# Patient Record
Sex: Female | Born: 1955 | Race: Black or African American | Hispanic: No | Smoking: Light tobacco smoker
Health system: Southern US, Community
[De-identification: ages and names within clinical notes are randomized; demographics above are authoritative.]

## PROBLEM LIST (undated history)

## (undated) DIAGNOSIS — E785 Hyperlipidemia, unspecified: Secondary | ICD-10-CM

## (undated) DIAGNOSIS — F329 Major depressive disorder, single episode, unspecified: Secondary | ICD-10-CM

## (undated) DIAGNOSIS — Z21 Asymptomatic human immunodeficiency virus [HIV] infection status: Secondary | ICD-10-CM

## (undated) DIAGNOSIS — B20 Human immunodeficiency virus [HIV] disease: Secondary | ICD-10-CM

## (undated) DIAGNOSIS — F32A Depression, unspecified: Secondary | ICD-10-CM

## (undated) HISTORY — DX: Human immunodeficiency virus (HIV) disease: B20

## (undated) HISTORY — DX: Asymptomatic human immunodeficiency virus (hiv) infection status: Z21

## (undated) HISTORY — PX: ABDOMINAL HYSTERECTOMY: SHX81

## (undated) HISTORY — DX: Hyperlipidemia, unspecified: E78.5

## (undated) HISTORY — DX: Major depressive disorder, single episode, unspecified: F32.9

## (undated) HISTORY — DX: Depression, unspecified: F32.A

---

## 1990-08-13 ENCOUNTER — Encounter (INDEPENDENT_AMBULATORY_CARE_PROVIDER_SITE_OTHER): Payer: Self-pay | Admitting: *Deleted

## 1990-08-13 LAB — CONVERTED CEMR LAB
CD4 Count: 257 microliters
CD4 T Cell Abs: 257

## 1998-02-26 ENCOUNTER — Encounter: Admission: RE | Admit: 1998-02-26 | Discharge: 1998-02-26 | Payer: Self-pay | Admitting: Internal Medicine

## 1998-03-21 ENCOUNTER — Encounter: Admission: RE | Admit: 1998-03-21 | Discharge: 1998-03-21 | Payer: Self-pay | Admitting: Internal Medicine

## 1998-06-05 ENCOUNTER — Encounter: Admission: RE | Admit: 1998-06-05 | Discharge: 1998-06-05 | Payer: Self-pay | Admitting: Internal Medicine

## 1998-07-09 ENCOUNTER — Encounter: Admission: RE | Admit: 1998-07-09 | Discharge: 1998-07-09 | Payer: Self-pay | Admitting: Internal Medicine

## 1998-10-16 ENCOUNTER — Encounter: Admission: RE | Admit: 1998-10-16 | Discharge: 1998-10-16 | Payer: Self-pay | Admitting: Internal Medicine

## 1998-12-18 ENCOUNTER — Encounter: Admission: RE | Admit: 1998-12-18 | Discharge: 1998-12-18 | Payer: Self-pay | Admitting: Internal Medicine

## 1999-01-01 ENCOUNTER — Ambulatory Visit (HOSPITAL_COMMUNITY): Admission: RE | Admit: 1999-01-01 | Discharge: 1999-01-01 | Payer: Self-pay

## 1999-02-04 ENCOUNTER — Ambulatory Visit (HOSPITAL_COMMUNITY): Admission: RE | Admit: 1999-02-04 | Discharge: 1999-02-04 | Payer: Self-pay | Admitting: Internal Medicine

## 1999-02-26 ENCOUNTER — Encounter: Admission: RE | Admit: 1999-02-26 | Discharge: 1999-02-26 | Payer: Self-pay | Admitting: Internal Medicine

## 1999-04-09 ENCOUNTER — Encounter: Admission: RE | Admit: 1999-04-09 | Discharge: 1999-04-09 | Payer: Self-pay | Admitting: Internal Medicine

## 1999-05-10 ENCOUNTER — Inpatient Hospital Stay (HOSPITAL_COMMUNITY): Admission: EM | Admit: 1999-05-10 | Discharge: 1999-05-13 | Payer: Self-pay | Admitting: Emergency Medicine

## 1999-05-10 ENCOUNTER — Encounter: Payer: Self-pay | Admitting: Emergency Medicine

## 1999-05-11 ENCOUNTER — Encounter: Payer: Self-pay | Admitting: Internal Medicine

## 1999-07-08 ENCOUNTER — Encounter: Admission: RE | Admit: 1999-07-08 | Discharge: 1999-07-08 | Payer: Self-pay | Admitting: Internal Medicine

## 1999-08-13 ENCOUNTER — Encounter: Admission: RE | Admit: 1999-08-13 | Discharge: 1999-08-13 | Payer: Self-pay | Admitting: Internal Medicine

## 1999-12-03 ENCOUNTER — Ambulatory Visit (HOSPITAL_COMMUNITY): Admission: RE | Admit: 1999-12-03 | Discharge: 1999-12-03 | Payer: Self-pay | Admitting: Internal Medicine

## 1999-12-03 ENCOUNTER — Encounter: Admission: RE | Admit: 1999-12-03 | Discharge: 1999-12-03 | Payer: Self-pay | Admitting: Internal Medicine

## 1999-12-24 ENCOUNTER — Encounter: Admission: RE | Admit: 1999-12-24 | Discharge: 1999-12-24 | Payer: Self-pay | Admitting: Internal Medicine

## 2000-03-17 ENCOUNTER — Ambulatory Visit (HOSPITAL_COMMUNITY): Admission: RE | Admit: 2000-03-17 | Discharge: 2000-03-17 | Payer: Self-pay | Admitting: Internal Medicine

## 2000-03-17 ENCOUNTER — Encounter: Admission: RE | Admit: 2000-03-17 | Discharge: 2000-03-17 | Payer: Self-pay | Admitting: Internal Medicine

## 2000-05-19 ENCOUNTER — Encounter: Admission: RE | Admit: 2000-05-19 | Discharge: 2000-05-19 | Payer: Self-pay | Admitting: Internal Medicine

## 2000-06-22 ENCOUNTER — Encounter: Admission: RE | Admit: 2000-06-22 | Discharge: 2000-06-22 | Payer: Self-pay | Admitting: Internal Medicine

## 2000-11-03 ENCOUNTER — Encounter: Admission: RE | Admit: 2000-11-03 | Discharge: 2000-11-03 | Payer: Self-pay | Admitting: Internal Medicine

## 2000-11-03 ENCOUNTER — Ambulatory Visit (HOSPITAL_COMMUNITY): Admission: RE | Admit: 2000-11-03 | Discharge: 2000-11-03 | Payer: Self-pay | Admitting: Internal Medicine

## 2001-01-12 ENCOUNTER — Ambulatory Visit (HOSPITAL_COMMUNITY): Admission: RE | Admit: 2001-01-12 | Discharge: 2001-01-12 | Payer: Self-pay | Admitting: Internal Medicine

## 2001-01-12 ENCOUNTER — Encounter: Admission: RE | Admit: 2001-01-12 | Discharge: 2001-01-12 | Payer: Self-pay | Admitting: Internal Medicine

## 2001-05-18 ENCOUNTER — Encounter: Admission: RE | Admit: 2001-05-18 | Discharge: 2001-05-18 | Payer: Self-pay | Admitting: Internal Medicine

## 2001-07-06 ENCOUNTER — Encounter: Admission: RE | Admit: 2001-07-06 | Discharge: 2001-07-06 | Payer: Self-pay | Admitting: Internal Medicine

## 2001-08-17 ENCOUNTER — Ambulatory Visit (HOSPITAL_COMMUNITY): Admission: RE | Admit: 2001-08-17 | Discharge: 2001-08-17 | Payer: Self-pay | Admitting: Internal Medicine

## 2001-08-17 ENCOUNTER — Encounter: Admission: RE | Admit: 2001-08-17 | Discharge: 2001-08-17 | Payer: Self-pay | Admitting: Internal Medicine

## 2001-12-14 ENCOUNTER — Encounter: Admission: RE | Admit: 2001-12-14 | Discharge: 2001-12-14 | Payer: Self-pay | Admitting: Internal Medicine

## 2001-12-14 ENCOUNTER — Ambulatory Visit (HOSPITAL_COMMUNITY): Admission: RE | Admit: 2001-12-14 | Discharge: 2001-12-14 | Payer: Self-pay | Admitting: Internal Medicine

## 2002-04-19 ENCOUNTER — Encounter: Admission: RE | Admit: 2002-04-19 | Discharge: 2002-04-19 | Payer: Self-pay | Admitting: Internal Medicine

## 2002-04-26 ENCOUNTER — Encounter: Admission: RE | Admit: 2002-04-26 | Discharge: 2002-04-26 | Payer: Self-pay | Admitting: Internal Medicine

## 2002-04-26 ENCOUNTER — Ambulatory Visit (HOSPITAL_COMMUNITY): Admission: RE | Admit: 2002-04-26 | Discharge: 2002-04-26 | Payer: Self-pay | Admitting: Internal Medicine

## 2002-06-21 ENCOUNTER — Encounter: Admission: RE | Admit: 2002-06-21 | Discharge: 2002-06-21 | Payer: Self-pay | Admitting: Internal Medicine

## 2003-03-14 ENCOUNTER — Encounter: Payer: Self-pay | Admitting: Internal Medicine

## 2003-03-14 ENCOUNTER — Encounter: Admission: RE | Admit: 2003-03-14 | Discharge: 2003-03-14 | Payer: Self-pay | Admitting: Internal Medicine

## 2003-05-02 ENCOUNTER — Encounter: Admission: RE | Admit: 2003-05-02 | Discharge: 2003-05-02 | Payer: Self-pay | Admitting: Internal Medicine

## 2003-08-01 ENCOUNTER — Ambulatory Visit (HOSPITAL_COMMUNITY): Admission: RE | Admit: 2003-08-01 | Discharge: 2003-08-01 | Payer: Self-pay | Admitting: Internal Medicine

## 2003-08-01 ENCOUNTER — Encounter: Admission: RE | Admit: 2003-08-01 | Discharge: 2003-08-01 | Payer: Self-pay | Admitting: Internal Medicine

## 2003-08-01 ENCOUNTER — Encounter: Payer: Self-pay | Admitting: Internal Medicine

## 2003-11-28 ENCOUNTER — Encounter: Admission: RE | Admit: 2003-11-28 | Discharge: 2003-11-28 | Payer: Self-pay | Admitting: Internal Medicine

## 2004-02-27 ENCOUNTER — Encounter: Admission: RE | Admit: 2004-02-27 | Discharge: 2004-02-27 | Payer: Self-pay | Admitting: Internal Medicine

## 2004-05-28 ENCOUNTER — Encounter: Admission: RE | Admit: 2004-05-28 | Discharge: 2004-05-28 | Payer: Self-pay | Admitting: Internal Medicine

## 2004-05-28 ENCOUNTER — Ambulatory Visit (HOSPITAL_COMMUNITY): Admission: RE | Admit: 2004-05-28 | Discharge: 2004-05-28 | Payer: Self-pay | Admitting: Internal Medicine

## 2004-08-20 ENCOUNTER — Ambulatory Visit: Payer: Self-pay | Admitting: Internal Medicine

## 2004-08-20 ENCOUNTER — Ambulatory Visit (HOSPITAL_COMMUNITY): Admission: RE | Admit: 2004-08-20 | Discharge: 2004-08-20 | Payer: Self-pay | Admitting: Internal Medicine

## 2005-03-18 ENCOUNTER — Ambulatory Visit (HOSPITAL_COMMUNITY): Admission: RE | Admit: 2005-03-18 | Discharge: 2005-03-18 | Payer: Self-pay | Admitting: Internal Medicine

## 2005-03-18 ENCOUNTER — Ambulatory Visit: Payer: Self-pay | Admitting: Internal Medicine

## 2005-04-10 ENCOUNTER — Ambulatory Visit: Payer: Self-pay | Admitting: Internal Medicine

## 2005-06-10 ENCOUNTER — Ambulatory Visit: Payer: Self-pay | Admitting: Internal Medicine

## 2005-08-12 ENCOUNTER — Ambulatory Visit (HOSPITAL_COMMUNITY): Admission: RE | Admit: 2005-08-12 | Discharge: 2005-08-12 | Payer: Self-pay | Admitting: Internal Medicine

## 2005-08-12 ENCOUNTER — Ambulatory Visit: Payer: Self-pay | Admitting: Internal Medicine

## 2005-11-04 ENCOUNTER — Ambulatory Visit: Payer: Self-pay | Admitting: Internal Medicine

## 2005-12-18 ENCOUNTER — Ambulatory Visit: Payer: Self-pay | Admitting: Internal Medicine

## 2005-12-18 ENCOUNTER — Encounter: Admission: RE | Admit: 2005-12-18 | Discharge: 2005-12-18 | Payer: Self-pay | Admitting: Internal Medicine

## 2005-12-18 ENCOUNTER — Encounter (INDEPENDENT_AMBULATORY_CARE_PROVIDER_SITE_OTHER): Payer: Self-pay | Admitting: *Deleted

## 2005-12-18 LAB — CONVERTED CEMR LAB
CD4 Count: 360 microliters
HIV 1 RNA Quant: 1000001 copies/mL

## 2006-03-26 ENCOUNTER — Encounter (INDEPENDENT_AMBULATORY_CARE_PROVIDER_SITE_OTHER): Payer: Self-pay | Admitting: *Deleted

## 2006-03-26 ENCOUNTER — Ambulatory Visit: Payer: Self-pay | Admitting: Internal Medicine

## 2006-03-26 ENCOUNTER — Encounter: Admission: RE | Admit: 2006-03-26 | Discharge: 2006-03-26 | Payer: Self-pay | Admitting: Internal Medicine

## 2006-03-26 LAB — CONVERTED CEMR LAB
CD4 Count: 310 microliters
HIV 1 RNA Quant: 399 copies/mL

## 2006-05-19 ENCOUNTER — Ambulatory Visit: Payer: Self-pay | Admitting: Internal Medicine

## 2006-07-14 ENCOUNTER — Encounter: Admission: RE | Admit: 2006-07-14 | Discharge: 2006-07-14 | Payer: Self-pay | Admitting: Internal Medicine

## 2006-07-14 ENCOUNTER — Encounter (INDEPENDENT_AMBULATORY_CARE_PROVIDER_SITE_OTHER): Payer: Self-pay | Admitting: *Deleted

## 2006-07-14 ENCOUNTER — Ambulatory Visit: Payer: Self-pay | Admitting: Internal Medicine

## 2006-07-14 LAB — CONVERTED CEMR LAB
CD4 Count: 290 microliters
HIV 1 RNA Quant: 49 copies/mL

## 2006-07-23 DIAGNOSIS — N879 Dysplasia of cervix uteri, unspecified: Secondary | ICD-10-CM | POA: Insufficient documentation

## 2006-07-23 DIAGNOSIS — B360 Pityriasis versicolor: Secondary | ICD-10-CM | POA: Insufficient documentation

## 2006-07-23 DIAGNOSIS — F329 Major depressive disorder, single episode, unspecified: Secondary | ICD-10-CM

## 2006-07-23 DIAGNOSIS — R599 Enlarged lymph nodes, unspecified: Secondary | ICD-10-CM | POA: Insufficient documentation

## 2006-07-23 DIAGNOSIS — B0052 Herpesviral keratitis: Secondary | ICD-10-CM

## 2006-07-23 DIAGNOSIS — K029 Dental caries, unspecified: Secondary | ICD-10-CM | POA: Insufficient documentation

## 2006-07-23 DIAGNOSIS — A6000 Herpesviral infection of urogenital system, unspecified: Secondary | ICD-10-CM | POA: Insufficient documentation

## 2006-07-23 DIAGNOSIS — B3781 Candidal esophagitis: Secondary | ICD-10-CM | POA: Insufficient documentation

## 2006-07-23 DIAGNOSIS — B20 Human immunodeficiency virus [HIV] disease: Secondary | ICD-10-CM | POA: Insufficient documentation

## 2006-07-23 DIAGNOSIS — B078 Other viral warts: Secondary | ICD-10-CM | POA: Insufficient documentation

## 2006-07-23 DIAGNOSIS — L0293 Carbuncle, unspecified: Secondary | ICD-10-CM

## 2006-07-23 DIAGNOSIS — H1045 Other chronic allergic conjunctivitis: Secondary | ICD-10-CM

## 2006-07-23 DIAGNOSIS — N739 Female pelvic inflammatory disease, unspecified: Secondary | ICD-10-CM | POA: Insufficient documentation

## 2006-07-23 DIAGNOSIS — D649 Anemia, unspecified: Secondary | ICD-10-CM

## 2006-07-23 DIAGNOSIS — F411 Generalized anxiety disorder: Secondary | ICD-10-CM | POA: Insufficient documentation

## 2006-07-23 DIAGNOSIS — F1910 Other psychoactive substance abuse, uncomplicated: Secondary | ICD-10-CM

## 2006-07-23 DIAGNOSIS — L0292 Furuncle, unspecified: Secondary | ICD-10-CM | POA: Insufficient documentation

## 2006-07-23 DIAGNOSIS — A539 Syphilis, unspecified: Secondary | ICD-10-CM

## 2006-07-23 DIAGNOSIS — J189 Pneumonia, unspecified organism: Secondary | ICD-10-CM | POA: Insufficient documentation

## 2006-11-04 ENCOUNTER — Encounter: Payer: Self-pay | Admitting: Internal Medicine

## 2006-12-02 DIAGNOSIS — Z9089 Acquired absence of other organs: Secondary | ICD-10-CM

## 2006-12-07 ENCOUNTER — Encounter (INDEPENDENT_AMBULATORY_CARE_PROVIDER_SITE_OTHER): Payer: Self-pay | Admitting: *Deleted

## 2006-12-07 LAB — CONVERTED CEMR LAB: Pap Smear: ABNORMAL

## 2006-12-08 ENCOUNTER — Telehealth (INDEPENDENT_AMBULATORY_CARE_PROVIDER_SITE_OTHER): Payer: Self-pay | Admitting: *Deleted

## 2006-12-08 ENCOUNTER — Ambulatory Visit: Payer: Self-pay | Admitting: Internal Medicine

## 2006-12-20 ENCOUNTER — Encounter (INDEPENDENT_AMBULATORY_CARE_PROVIDER_SITE_OTHER): Payer: Self-pay | Admitting: *Deleted

## 2007-02-17 ENCOUNTER — Telehealth: Payer: Self-pay

## 2007-03-30 ENCOUNTER — Ambulatory Visit: Payer: Self-pay | Admitting: Internal Medicine

## 2007-03-30 ENCOUNTER — Encounter: Admission: RE | Admit: 2007-03-30 | Discharge: 2007-03-30 | Payer: Self-pay | Admitting: Internal Medicine

## 2007-04-20 ENCOUNTER — Encounter: Payer: Self-pay | Admitting: Internal Medicine

## 2007-04-27 ENCOUNTER — Encounter: Payer: Self-pay | Admitting: Internal Medicine

## 2007-04-27 ENCOUNTER — Other Ambulatory Visit: Admission: RE | Admit: 2007-04-27 | Discharge: 2007-04-27 | Payer: Self-pay | Admitting: Otolaryngology

## 2007-05-06 ENCOUNTER — Encounter: Payer: Self-pay | Admitting: Internal Medicine

## 2007-05-14 ENCOUNTER — Encounter: Payer: Self-pay | Admitting: Internal Medicine

## 2007-05-17 ENCOUNTER — Encounter (INDEPENDENT_AMBULATORY_CARE_PROVIDER_SITE_OTHER): Payer: Self-pay | Admitting: *Deleted

## 2007-05-18 ENCOUNTER — Ambulatory Visit: Payer: Self-pay | Admitting: Internal Medicine

## 2007-05-18 DIAGNOSIS — B351 Tinea unguium: Secondary | ICD-10-CM

## 2007-07-16 ENCOUNTER — Encounter: Payer: Self-pay | Admitting: Internal Medicine

## 2007-11-04 ENCOUNTER — Telehealth: Payer: Self-pay | Admitting: Internal Medicine

## 2007-11-11 ENCOUNTER — Encounter: Payer: Self-pay | Admitting: Internal Medicine

## 2007-11-16 ENCOUNTER — Encounter: Admission: RE | Admit: 2007-11-16 | Discharge: 2007-11-16 | Payer: Self-pay | Admitting: Internal Medicine

## 2007-11-16 ENCOUNTER — Ambulatory Visit: Payer: Self-pay | Admitting: Internal Medicine

## 2007-11-16 DIAGNOSIS — H60399 Other infective otitis externa, unspecified ear: Secondary | ICD-10-CM | POA: Insufficient documentation

## 2007-11-16 LAB — CONVERTED CEMR LAB
Alkaline Phosphatase: 99 units/L (ref 39–117)
BUN: 14 mg/dL (ref 6–23)
Glucose, Bld: 83 mg/dL (ref 70–99)
HDL: 41 mg/dL (ref >39)
LDL Cholesterol: 107 mg/dL — ABNORMAL HIGH (ref 0–99)
MCHC: 32.1 g/dL (ref 30.0–36.0)
MCV: 86.7 fL (ref 78.0–100.0)
RBC: 4.67 M/uL (ref 3.87–5.11)
Sodium: 143 meq/L (ref 135–145)
Total Bilirubin: 0.3 mg/dL (ref 0.3–1.2)
Total CHOL/HDL Ratio: 5.1
Total Protein: 7.2 g/dL (ref 6.0–8.3)
Triglycerides: 308 mg/dL — ABNORMAL HIGH (ref <150)
VLDL: 62 mg/dL — ABNORMAL HIGH (ref 0–40)

## 2008-04-18 ENCOUNTER — Encounter: Admission: RE | Admit: 2008-04-18 | Discharge: 2008-04-18 | Payer: Self-pay | Admitting: Internal Medicine

## 2008-04-18 ENCOUNTER — Ambulatory Visit: Payer: Self-pay | Admitting: Internal Medicine

## 2008-04-18 LAB — CONVERTED CEMR LAB
ALT: 31 units/L (ref 0–35)
Alkaline Phosphatase: 101 units/L (ref 39–117)
Basophils Absolute: 0 10*3/uL (ref 0.0–0.1)
Creatinine, Ser: 1.15 mg/dL (ref 0.40–1.20)
Eosinophils Absolute: 0.1 10*3/uL (ref 0.0–0.7)
Eosinophils Relative: 3 % (ref 0–5)
HCT: 39 % (ref 36.0–46.0)
HIV-1 RNA Quant, Log: <1.7 (ref <1.70)
MCHC: 31 g/dL (ref 30.0–36.0)
MCV: 87.6 fL (ref 78.0–100.0)
Platelets: 228 10*3/uL (ref 150–400)
RDW: 14.4 % (ref 11.5–15.5)
Sodium: 145 meq/L (ref 135–145)
Total Bilirubin: 0.4 mg/dL (ref 0.3–1.2)
Total Protein: 6.8 g/dL (ref 6.0–8.3)

## 2008-05-09 ENCOUNTER — Ambulatory Visit: Payer: Self-pay | Admitting: Internal Medicine

## 2008-05-09 DIAGNOSIS — F17200 Nicotine dependence, unspecified, uncomplicated: Secondary | ICD-10-CM

## 2008-05-09 DIAGNOSIS — H409 Unspecified glaucoma: Secondary | ICD-10-CM | POA: Insufficient documentation

## 2008-06-07 ENCOUNTER — Encounter: Payer: Self-pay | Admitting: Internal Medicine

## 2008-06-16 ENCOUNTER — Telehealth: Payer: Self-pay | Admitting: Internal Medicine

## 2008-07-06 ENCOUNTER — Encounter: Payer: Self-pay | Admitting: Internal Medicine

## 2008-07-09 ENCOUNTER — Encounter: Payer: Self-pay | Admitting: Internal Medicine

## 2008-07-25 ENCOUNTER — Encounter: Payer: Self-pay | Admitting: Internal Medicine

## 2008-10-23 ENCOUNTER — Encounter: Payer: Self-pay | Admitting: Internal Medicine

## 2008-11-02 ENCOUNTER — Ambulatory Visit: Payer: Self-pay | Admitting: Internal Medicine

## 2008-11-02 LAB — CONVERTED CEMR LAB
ALT: 18 units/L (ref 0–35)
Alkaline Phosphatase: 91 units/L (ref 39–117)
BUN: 16 mg/dL (ref 6–23)
Basophils Relative: 1 % (ref 0–1)
Chloride: 108 meq/L (ref 96–112)
Glucose, Bld: 68 mg/dL — ABNORMAL LOW (ref 70–99)
HIV 1 RNA Quant: <48 copies/mL (ref <48)
HIV-1 RNA Quant, Log: <1.68 (ref <1.68)
Hemoglobin: 12.7 g/dL (ref 12.0–15.0)
LDL Cholesterol: 152 mg/dL — ABNORMAL HIGH (ref 0–99)
Lymphs Abs: 1.9 10*3/uL (ref 0.7–4.0)
MCHC: 32.8 g/dL (ref 30.0–36.0)
MCV: 86 fL (ref 78.0–100.0)
Monocytes Absolute: 0.5 10*3/uL (ref 0.1–1.0)
Monocytes Relative: 14 % — ABNORMAL HIGH (ref 3–12)
Neutro Abs: 1.4 10*3/uL — ABNORMAL LOW (ref 1.7–7.7)
Potassium: 4.1 meq/L (ref 3.5–5.3)
RBC: 4.5 M/uL (ref 3.87–5.11)
Total Bilirubin: 0.6 mg/dL (ref 0.3–1.2)
Total Protein: 7 g/dL (ref 6.0–8.3)
VLDL: 46 mg/dL — ABNORMAL HIGH (ref 0–40)
WBC: 3.9 10*3/uL — ABNORMAL LOW (ref 4.0–10.5)

## 2008-11-15 ENCOUNTER — Encounter: Payer: Self-pay | Admitting: Internal Medicine

## 2008-11-16 ENCOUNTER — Ambulatory Visit: Payer: Self-pay | Admitting: Internal Medicine

## 2008-11-16 DIAGNOSIS — R61 Generalized hyperhidrosis: Secondary | ICD-10-CM | POA: Insufficient documentation

## 2008-12-05 ENCOUNTER — Encounter (INDEPENDENT_AMBULATORY_CARE_PROVIDER_SITE_OTHER): Payer: Self-pay | Admitting: *Deleted

## 2008-12-05 LAB — CONVERTED CEMR LAB: Pap Smear: ABNORMAL

## 2009-02-13 ENCOUNTER — Ambulatory Visit: Payer: Self-pay | Admitting: Internal Medicine

## 2009-02-13 DIAGNOSIS — R21 Rash and other nonspecific skin eruption: Secondary | ICD-10-CM | POA: Insufficient documentation

## 2009-05-17 ENCOUNTER — Ambulatory Visit: Payer: Self-pay | Admitting: Internal Medicine

## 2009-05-29 ENCOUNTER — Ambulatory Visit: Payer: Self-pay | Admitting: Internal Medicine

## 2009-06-12 ENCOUNTER — Encounter (INDEPENDENT_AMBULATORY_CARE_PROVIDER_SITE_OTHER): Payer: Self-pay | Admitting: *Deleted

## 2009-06-12 ENCOUNTER — Encounter: Payer: Self-pay | Admitting: Internal Medicine

## 2009-06-28 ENCOUNTER — Encounter (INDEPENDENT_AMBULATORY_CARE_PROVIDER_SITE_OTHER): Payer: Self-pay | Admitting: *Deleted

## 2009-06-28 ENCOUNTER — Encounter: Payer: Self-pay | Admitting: Internal Medicine

## 2009-08-08 ENCOUNTER — Encounter (INDEPENDENT_AMBULATORY_CARE_PROVIDER_SITE_OTHER): Payer: Self-pay | Admitting: *Deleted

## 2009-11-14 ENCOUNTER — Encounter (INDEPENDENT_AMBULATORY_CARE_PROVIDER_SITE_OTHER): Payer: Self-pay | Admitting: *Deleted

## 2009-12-25 ENCOUNTER — Ambulatory Visit: Payer: Self-pay | Admitting: Internal Medicine

## 2009-12-25 DIAGNOSIS — IMO0002 Reserved for concepts with insufficient information to code with codable children: Secondary | ICD-10-CM

## 2009-12-25 LAB — CONVERTED CEMR LAB
ALT: 13 units/L (ref 0–35)
AST: 19 units/L (ref 0–37)
Alkaline Phosphatase: 95 units/L (ref 39–117)
Basophils Absolute: 0 10*3/uL (ref 0.0–0.1)
Basophils Relative: 0 % (ref 0–1)
CO2: 27 meq/L (ref 19–32)
HIV 1 RNA Quant: <48 copies/mL (ref <48)
HIV-1 RNA Quant, Log: <1.68 (ref <1.68)
LDL Cholesterol: 158 mg/dL — ABNORMAL HIGH (ref 0–99)
MCHC: 31.2 g/dL (ref 30.0–36.0)
Monocytes Relative: 10 % (ref 3–12)
Neutro Abs: 2 10*3/uL (ref 1.7–7.7)
Neutrophils Relative %: 45 % (ref 43–77)
RBC: 4.85 M/uL (ref 3.87–5.11)
Sodium: 142 meq/L (ref 135–145)
Total Bilirubin: 0.5 mg/dL (ref 0.3–1.2)
Total Protein: 7.2 g/dL (ref 6.0–8.3)
VLDL: 40 mg/dL (ref 0–40)

## 2010-02-19 ENCOUNTER — Ambulatory Visit: Payer: Self-pay | Admitting: Internal Medicine

## 2010-02-19 DIAGNOSIS — E785 Hyperlipidemia, unspecified: Secondary | ICD-10-CM

## 2010-03-18 ENCOUNTER — Telehealth (INDEPENDENT_AMBULATORY_CARE_PROVIDER_SITE_OTHER): Payer: Self-pay | Admitting: *Deleted

## 2010-03-20 ENCOUNTER — Telehealth (INDEPENDENT_AMBULATORY_CARE_PROVIDER_SITE_OTHER): Payer: Self-pay | Admitting: *Deleted

## 2010-03-29 ENCOUNTER — Ambulatory Visit: Payer: Self-pay | Admitting: Internal Medicine

## 2010-03-29 DIAGNOSIS — M79609 Pain in unspecified limb: Secondary | ICD-10-CM

## 2010-04-01 ENCOUNTER — Telehealth: Payer: Self-pay | Admitting: Internal Medicine

## 2010-09-02 ENCOUNTER — Encounter (INDEPENDENT_AMBULATORY_CARE_PROVIDER_SITE_OTHER): Payer: Self-pay | Admitting: *Deleted

## 2010-09-17 ENCOUNTER — Ambulatory Visit: Payer: Self-pay | Admitting: Internal Medicine

## 2010-09-17 LAB — CONVERTED CEMR LAB
HIV 1 RNA Quant: <20 copies/mL (ref <20)
HIV-1 RNA Quant, Log: <1.3 (ref <1.30)

## 2010-09-26 ENCOUNTER — Encounter (INDEPENDENT_AMBULATORY_CARE_PROVIDER_SITE_OTHER): Payer: Self-pay | Admitting: *Deleted

## 2010-11-10 LAB — CONVERTED CEMR LAB
ALT: 19 units/L (ref 0–35)
AST: 23 units/L (ref 0–37)
Albumin: 4.4 g/dL (ref 3.5–5.2)
Alkaline Phosphatase: 112 units/L (ref 39–117)
BUN: 15 mg/dL (ref 6–23)
CO2: 24 meq/L (ref 19–32)
Calcium: 10.5 mg/dL (ref 8.4–10.5)
Chloride: 107 meq/L (ref 96–112)
Cholesterol: 246 mg/dL — ABNORMAL HIGH (ref 0–200)
Creatinine, Ser: 1.12 mg/dL (ref 0.40–1.20)
Glucose, Bld: 82 mg/dL (ref 70–99)
HCT: 41.7 % (ref 36.0–46.0)
HDL: 56 mg/dL (ref >39)
HIV 1 RNA Quant: <50 copies/mL (ref <50)
Hemoglobin: 13.3 g/dL (ref 12.0–15.0)
LDL Cholesterol: 152 mg/dL — ABNORMAL HIGH (ref 0–99)
MCHC: 31.9 g/dL (ref 30.0–36.0)
MCV: 85.8 fL (ref 78.0–100.0)
Platelets: 279 10*3/uL (ref 150–400)
Potassium: 4.7 meq/L (ref 3.5–5.3)
RBC: 4.86 M/uL (ref 3.87–5.11)
RDW: 15.2 % — ABNORMAL HIGH (ref 11.5–14.0)
Sodium: 140 meq/L (ref 135–145)
Total Bilirubin: 0.5 mg/dL (ref 0.3–1.2)
Total CHOL/HDL Ratio: 4.4
Total Protein: 7.9 g/dL (ref 6.0–8.3)
Triglycerides: 191 mg/dL — ABNORMAL HIGH (ref <150)
VLDL: 38 mg/dL (ref 0–40)
WBC: 5.7 10*3/uL (ref 4.0–10.5)

## 2010-11-14 NOTE — Progress Notes (Signed)
Summary: right knee/upper leg pain x 5 days, requesting appt.  Phone Note Call from Patient Call back at [phone]   Caller: Patient Reason for Call: Acute Illness Summary of Call: Cell # [phone].  Pain in right knee and thigh x 5 days.  Unable to come this week due to transportation issues.  Appt. made for next wk. with Dr. Philipp Deputy, 03/27/10 @ 0900. [name] RN  March 18, 2010 11:19 AM
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
## 2010-11-14 NOTE — Letter (Signed)
Summary: Generic Letter  [facility]  [address]   [place], [place] [address]   Phone: [phone]  Fax: [phone]          June 12, 2009  [facility] [address] [place], [place]  [address]  Dear Ms. Raider,  Please make a follow-up appointment with [facility] for your  59-month PAP Smear.  Please sign a release of information for results to be sent to Dr. Celesta Aver office, fax # [phone].   Thank you.  Sincerely,   [name] RN [facility]
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
## 2010-11-14 NOTE — Miscellaneous (Signed)
Summary: Problem list update  Clinical Lists Changes  Problems: Added new problem of LONG-TERM (CURRENT) USE OF OTHER MEDICATIONS (ICD-V58.69) 

## 2010-11-14 NOTE — Miscellaneous (Signed)
  Clinical Lists Changes  Observations: Added new observation of YEARAIDSPOS: 2004  (09/02/2010 12:52)

## 2010-11-14 NOTE — Progress Notes (Signed)
Summary: pt. requesting muscle relaxant for leg pain  Phone Note Call from Patient Call back at [phone]   Caller: Patient Call For: Dr. Philipp Deputy Reason for Call: Acute Illness, Talk to Doctor Summary of Call: Pt. does believe the pain is muscular.  Requesting muscle relaxant to be called in to her pharmacy, [facility] in [place].  Please adivse drug name, dose, frequency and amount.  [name] RN  April 01, 2010 2:58 PM   Follow-up for Phone Call        flexeril 10mg  q8 hours as needed #20 Follow-up by: Yisroel Ramming MD,  April 01, 2010 3:00 PM    New/Updated Medications: FLEXERIL 10 MG TABS (CYCLOBENZAPRINE HCL) Take 1 tablet by mouth every 8 hours as needed Prescriptions: FLEXERIL 10 MG TABS (CYCLOBENZAPRINE HCL) Take 1 tablet by mouth every 8 hours as needed  #20 x 0   Entered by:   [name] RN   Authorized by:   Yisroel Ramming MD   Signed by:   [name] RN on 04/02/2010   Method used:   Telephoned to ...       [facility] [address] [facility] (retail)             [place], [place]    [place]       Ph: [phone]       Fax:    RxID:   [id number]  Pt. called to let her know that rx was called in to her pharmacy. [name] RN  April 02, 2010 11:03 AM

## 2010-11-14 NOTE — Assessment & Plan Note (Signed)
Summary: 6WK F/U/VS   CC:  follow-up visit.  History of Present Illness: Kara Munoz is in for her routine visit.  She takes her medication every night at midnight and does not miss doses.  She is feeling better since the unexpected death of her mother and best friend earlier this year.  She was scheduled for a colonoscopy by her primary care doctor but chickened out and did not go.  She is willing to reschedule.  Preventive Screening-Counseling & Management  Alcohol-Tobacco     Alcohol drinks/day: 0     Alcohol type: wine     Smoking Status: current     Smoking Cessation Counseling: yes     Smoke Cessation Stage: ready     Packs/Day: <0.25     Year Started: 1985     Passive Smoke Exposure: yes  Caffeine-Diet-Exercise     Caffeine use/day: yes     Does Patient Exercise: no     Type of exercise: walking, no car     Times/week: 7  Hep-HIV-STD-Contraception     HIV Risk: no risk noted     HIV Risk Counseling: not indicated-no HIV risk noted  Safety-Violence-Falls     Seat Belt Use: yes  Comments: declined condoms      Sexual History:  currently monogamous and room mate type of relationship[.        Drug Use:  former.     Prior Medication List:  TRUVADA 200-300 MG TABS (EMTRICITABINE-TENOFOVIR) Take 1 tablet by mouth once a day KALETRA 200-50 MG TABS (LOPINAVIR-RITONAVIR) Take4 tablets by mouth once a day VALTREX 500 MG TABS (VALACYCLOVIR HCL) Take 1 tablet by mouth once a day TRAVATAN Z 0.004 %  SOLN (TRAVOPROST) 1 drop ou at bedtime PATANOL 0.1 % SOLN (OLOPATADINE HCL)  ENSURE   LIQD (NUTRITIONAL SUPPLEMENTS) 1 can by mouth daily   Current Allergies (reviewed today): No known allergies  Social History: Sexual History:  currently monogamous, room mate type of relationship[  Vital Signs:  Patient profile:   55 year old female Menstrual status:  postmenopausal Height:      65.5 inches (166.37 cm) Weight:      121.25 pounds (55.11 kg) BMI:     19.94 Temp:     97.7  degrees F (36.50 degrees C) oral Pulse rate:   65 / minute BP sitting:   148 / 77  (left arm) Cuff size:   regular  Vitals Entered By: [name] RN (Feb 19, 2010 9:36 AM) CC: follow-up visit Is Patient Diabetic? No Pain Assessment Patient in pain? no      Nutritional Status BMI of < 19 = underweight Nutritional Status Detail appetite "good"  Have you ever been in a relationship where you felt threatened, hurt or afraid?No   Does patient need assistance? Functional Status Self care Ambulation Normal Comments no missed doses   Physical Exam  General:  alert and well-nourished.   Mouth:  pharynx pink and moist, no erythema, no exudates, and edentulous.   Lungs:  normal breath sounds, no crackles, and no wheezes.   Heart:  normal rate, regular rhythm, and no murmur.      Impression & Recommendations:  Problem # 1:  HIV DISEASE (ICD-042) Ramina is in good spirits today.  Her adherence is excellent and her HIV infection remains at her very good control.  I will not make any changes today. Her updated medication list for this problem includes:    Valtrex 500 Mg Tabs (Valacyclovir hcl) .Marland KitchenMarland KitchenMarland KitchenMarland Kitchen  Take 1 tablet by mouth once a day  Diagnostics Reviewed:  HIV: CDC-defined AIDS (11/14/2009)   CD4: 330 (12/26/2009)   WBC: 4.4 (12/25/2009)   Hgb: 13.4 (12/25/2009)   HCT: 43.0 (12/25/2009)   Platelets: 281 (12/25/2009) HIV-1 RNA: <48 copies/mL (12/25/2009)   HBSAg: No (12/07/2006)  Problem # 2:  DYSLIPIDEMIA (ICD-272.4) I have asked her to research low fat diet and consider making some changes in what she eats.   Labs Reviewed: SGOT: 19 (12/25/2009)   SGPT: 13 (12/25/2009)   HDL:49 (12/25/2009), 45 (11/02/2008)  LDL:158 (12/25/2009), 152 (11/02/2008)  Chol:247 (12/25/2009), 243 (11/02/2008)  Trig:200 (12/25/2009), 229 (11/02/2008)  Other Orders: Est. Patient Level III (21308) Future Orders: T-CD4SP ([facility]) (CD4SP) ... 08/18/2010 T-HIV Viral Load [phone]) ...  08/18/2010 T-Lipid Profile [id number]) ... 08/18/2010  Patient Instructions: 1)  Please schedule a follow-up appointment in 6 months.

## 2010-11-14 NOTE — Progress Notes (Signed)
Summary: refill/mld  Phone Note Refill Request Message from:  Fax from Pharmacy on March 20, 2010 3:38 PM  Refills Requested: Medication #1:  TRUVADA 200-300 MG TABS Take 1 tablet by mouth once a day   Last Refilled: 02/27/2010  Medication #2:  [place] 200-50 MG TABS Take4 tablets by mouth once a day   Last Refilled: 02/27/2010  Method Requested: Fax to Local Pharmacy Next Appointment Scheduled: March 27, 2010 Initial call taken by: Paulo Fruit  BS,CPht II,MPH,  March 20, 2010 3:38 PM Caller: [facility] Reason for Call: Needs renewal    Prescriptions: KALETRA 200-50 MG TABS (LOPINAVIR-RITONAVIR) Take4 tablets by mouth once a day  #120 x 11   Entered by:   Paulo Fruit  BS,CPht II,MPH   Authorized by:   Cliffton Asters MD   Signed by:   Paulo Fruit  BS,CPht II,MPH on 03/20/2010   Method used:   Telephoned to ...       [facility] [address] [facility] (retail)             [place], [place]    [place]       Ph: [phone]       Fax:    RxID:   [id number] TRUVADA 200-300 MG TABS (EMTRICITABINE-TENOFOVIR) Take 1 tablet by mouth once a day  #30 x 11   Entered by:   Paulo Fruit  BS,CPht II,MPH   Authorized by:   Cliffton Asters MD   Signed by:   Paulo Fruit  BS,CPht II,MPH on 03/20/2010   Method used:   Telephoned to ...       [facility] [address] [facility] (retail)             [place], [place]    [place]       Ph: [phone]       Fax:    RxID:   [id number]  Paulo Fruit  BS,CPht II,MPH  March 20, 2010 3:39 PM

## 2010-11-14 NOTE — Assessment & Plan Note (Signed)
Summary: right knee/upper leg pain x 5 days /dde   CC:  pt. c/o right knee and leg pain and left heel pain x 3 weeks.  History of Present Illness: Pt states that she has pain that begins in her right knee and extends posteriorly up her leg.  It seems like the pain is in the muscle.  It has been hurting for about 3 weeks.  No known injury. It only boeths her when she is walking or standing.  She has been wearing flip flops a lot lately. No new physical activity.  Preventive Screening-Counseling & Management  Alcohol-Tobacco     Alcohol drinks/day: 0     Alcohol type: wine     Smoking Status: current     Smoking Cessation Counseling: yes     Smoke Cessation Stage: ready     Packs/Day: <0.25     Year Started: 1985     Passive Smoke Exposure: yes  Caffeine-Diet-Exercise     Caffeine use/day: yes     Does Patient Exercise: no     Type of exercise: walking, no car     Times/week: 7  Hep-HIV-STD-Contraception     HIV Risk: no risk noted     HIV Risk Counseling: not indicated-no HIV risk noted  Safety-Violence-Falls     Seat Belt Use: yes      Sexual History:  currently monogamous and room mate type of relationship[.        Drug Use:  former.        Blood Transfusions:  no.        Travel History:  none.    Comments: pt. declined condoms   Updated Prior Medication List: TRUVADA 200-300 MG TABS (EMTRICITABINE-TENOFOVIR) Take 1 tablet by mouth once a day KALETRA 200-50 MG TABS (LOPINAVIR-RITONAVIR) Take4 tablets by mouth once a day VALTREX 500 MG TABS (VALACYCLOVIR HCL) Take 1 tablet by mouth once a day TRAVATAN Z 0.004 %  SOLN (TRAVOPROST) 1 drop ou at bedtime PATANOL 0.1 % SOLN (OLOPATADINE HCL)  ENSURE   LIQD (NUTRITIONAL SUPPLEMENTS) 1 can by mouth daily IBUPROFEN 800 MG TABS (IBUPROFEN) Take 1 tablet by mouth every 8 hours as needed  Current Allergies (reviewed today): No known allergies  Past History:  Past Medical History: Last updated:  07/23/2006 Anemia-NOS Depression HIV disease Anxiety  Review of Systems  The patient denies anorexia, fever, and weight loss.    Vital Signs:  Patient profile:   55 year old female Menstrual status:  postmenopausal Height:      65.5 inches (166.37 cm) Weight:      118.8 pounds (54.00 kg) BMI:     19.54 Temp:     97.8 degrees F (36.56 degrees C) oral Pulse rate:   71 / minute BP sitting:   115 / 75  (right arm)  Vitals Entered By: Wendall Mola CMA Duncan Dull) (March 29, 2010 9:34 AM) CC: pt. c/o right knee and leg pain and left heel pain x 3 weeks Is Patient Diabetic? No Pain Assessment Patient in pain? yes     Location: right knee Intensity: 10 Type: aching Onset of pain  Constant Nutritional Status BMI of 19 -24 = normal Nutritional Status Detail appetite "off and on"  Does patient need assistance? Functional Status Self care Ambulation Normal Comments no missed doses of meds per patient requesting ensure   Physical Exam  General:  alert, well-developed, well-nourished, and well-hydrated.   Head:  normocephalic and atraumatic.   Mouth:  pharynx pink  and moist.   Lungs:  normal breath sounds.     Impression & Recommendations:  Problem # 1:  LEG PAIN, RIGHT (ICD-729.5) appears to be muscular in etiology trial of Ibuprofen if pain persists pt to call Orders: Est. Patient Level III (04540)  Medications Added to Medication List This Visit: 1)  Ibuprofen 800 Mg Tabs (Ibuprofen) .... Take 1 tablet by mouth every 8 hours as needed Prescriptions: IBUPROFEN 800 MG TABS (IBUPROFEN) Take 1 tablet by mouth every 8 hours as needed  #60 x 1   Entered and Authorized by:   Yisroel Ramming MD   Signed by:   Yisroel Ramming MD on 03/29/2010   Method used:   Print then Give to Patient   RxID:   [id number]

## 2010-11-14 NOTE — Miscellaneous (Signed)
Summary: RW Update  Clinical Lists Changes  Observations: Added new observation of HIV STATUS: CDC-defined AIDS (11/14/2009 15:47)

## 2010-12-24 LAB — T-HELPER CELL (CD4) - (RCID CLINIC ONLY)
CD4 % Helper T Cell: 20 % — ABNORMAL LOW (ref 33–55)
CD4 T Cell Abs: 380 uL — ABNORMAL LOW (ref 400–2700)

## 2011-01-06 LAB — T-HELPER CELL (CD4) - (RCID CLINIC ONLY): CD4 T Cell Abs: 330 uL — ABNORMAL LOW (ref 400–2700)

## 2011-01-19 LAB — T-HELPER CELL (CD4) - (RCID CLINIC ONLY): CD4 T Cell Abs: 340 uL — ABNORMAL LOW (ref 400–2700)

## 2011-03-18 ENCOUNTER — Encounter: Payer: Self-pay | Admitting: Internal Medicine

## 2011-03-18 ENCOUNTER — Ambulatory Visit (INDEPENDENT_AMBULATORY_CARE_PROVIDER_SITE_OTHER): Payer: Medicare Other | Admitting: Internal Medicine

## 2011-03-18 VITALS — BP 140/93 | HR 58 | Temp 98.0°F | Ht 66.0 in | Wt 129.2 lb

## 2011-03-18 DIAGNOSIS — Z23 Encounter for immunization: Secondary | ICD-10-CM

## 2011-03-18 DIAGNOSIS — F17200 Nicotine dependence, unspecified, uncomplicated: Secondary | ICD-10-CM

## 2011-03-18 DIAGNOSIS — F329 Major depressive disorder, single episode, unspecified: Secondary | ICD-10-CM

## 2011-03-18 DIAGNOSIS — E785 Hyperlipidemia, unspecified: Secondary | ICD-10-CM

## 2011-03-18 DIAGNOSIS — B20 Human immunodeficiency virus [HIV] disease: Secondary | ICD-10-CM

## 2011-03-18 DIAGNOSIS — Z21 Asymptomatic human immunodeficiency virus [HIV] infection status: Secondary | ICD-10-CM

## 2011-03-18 NOTE — Assessment & Plan Note (Signed)
I talked to her about the importance of quitting. He I have given her written information about the [place] quit line.

## 2011-03-18 NOTE — Assessment & Plan Note (Signed)
Her HIV infection remains under excellent control. I will continue her current regimen. 

## 2011-03-18 NOTE — Assessment & Plan Note (Signed)
Her triglycerides have jumped recently. I talked to her about dietary modification. I will repeat her lipid profile and see her back in 3 months.

## 2011-03-18 NOTE — Assessment & Plan Note (Signed)
I encouraged her return to see her counselor.

## 2011-03-18 NOTE — Progress Notes (Signed)
  Subjective:    Patient ID: Kara Munoz, female    DOB: [birth year], 55 y.o.   MRN: [id number]  HPI Kara Munoz is in for her routine visit. She does not recall missing any of her medications. She continues to smoke cigarettes and says that she has not thought about quitting. She has not used any street drugs in many years but occasionally drinks wine in moderation. She continues to live with her friend, [name], but states they have not been sexually active in over 2 years. She says they are more like friends.  Last month she noticed that she started to feel more depressed. She cannot think of any trigger. She states that at one point she felt like calling me to see if [facility] could [facility] but felt better after talking to her granddaughter. She has not had any thoughts of hurting herself. She has not been back to see her counselor.  She is scheduled to have her annual Pap smear next month and will talk to her primary care doctor about having a colonoscopy as well.    Review of Systems     Objective:   Physical Exam  Constitutional: She appears well-developed and well-nourished. No distress.  HENT:  Mouth/Throat: Oropharynx is clear and moist. No oropharyngeal exudate.  Cardiovascular: Normal rate, regular rhythm and normal heart sounds.   Pulmonary/Chest: Breath sounds normal.  Psychiatric:       Initially her affect was somewhat sad and withdrawn but she perked up during examination.          Assessment & Plan:

## 2011-03-19 ENCOUNTER — Other Ambulatory Visit: Payer: Self-pay | Admitting: *Deleted

## 2011-03-20 ENCOUNTER — Other Ambulatory Visit: Payer: Self-pay | Admitting: *Deleted

## 2011-03-20 DIAGNOSIS — B20 Human immunodeficiency virus [HIV] disease: Secondary | ICD-10-CM

## 2011-03-20 MED ORDER — LOPINAVIR-RITONAVIR 200-50 MG PO TABS: 4.0000 | ORAL_TABLET | Freq: Every day | ORAL | Status: DC

## 2011-03-20 MED ORDER — EMTRICITABINE-TENOFOVIR DF 200-300 MG PO TABS: 1.0000 | ORAL_TABLET | Freq: Every day | ORAL | Status: DC

## 2011-07-01 ENCOUNTER — Ambulatory Visit (INDEPENDENT_AMBULATORY_CARE_PROVIDER_SITE_OTHER): Payer: Medicare Other | Admitting: Internal Medicine

## 2011-07-01 DIAGNOSIS — F17200 Nicotine dependence, unspecified, uncomplicated: Secondary | ICD-10-CM

## 2011-07-01 DIAGNOSIS — B20 Human immunodeficiency virus [HIV] disease: Secondary | ICD-10-CM

## 2011-07-01 DIAGNOSIS — Z113 Encounter for screening for infections with a predominantly sexual mode of transmission: Secondary | ICD-10-CM

## 2011-07-01 DIAGNOSIS — A6000 Herpesviral infection of urogenital system, unspecified: Secondary | ICD-10-CM

## 2011-07-01 DIAGNOSIS — Z23 Encounter for immunization: Secondary | ICD-10-CM

## 2011-07-01 DIAGNOSIS — E785 Hyperlipidemia, unspecified: Secondary | ICD-10-CM

## 2011-07-01 LAB — CBC WITH DIFFERENTIAL/PLATELET
Eosinophils Absolute: 0.2 10*3/uL (ref 0.0–0.7)
Lymphocytes Relative: 45 % (ref 12–46)
Lymphs Abs: 2.2 10*3/uL (ref 0.7–4.0)
Neutro Abs: 2 10*3/uL (ref 1.7–7.7)
Neutrophils Relative %: 41 % — ABNORMAL LOW (ref 43–77)
Platelets: 227 10*3/uL (ref 150–400)
RBC: 4.43 MIL/uL (ref 3.87–5.11)
WBC: 5 10*3/uL (ref 4.0–10.5)

## 2011-07-01 LAB — LIPID PANEL: Total CHOL/HDL Ratio: 6.2 Ratio

## 2011-07-01 LAB — RPR

## 2011-07-01 NOTE — Assessment & Plan Note (Signed)
Her adherence remains excellent. Her CD4 count last December was 380 and her viral load was undetectable at less than 20 at that time. I will continue her current regimen and recheck lab work today.

## 2011-07-01 NOTE — Progress Notes (Signed)
Addended by: Cliffton Asters on: 07/01/2011 11:19 AM   Modules accepted: Orders

## 2011-07-01 NOTE — Assessment & Plan Note (Signed)
She has the card with the [facility] cigarette quit line number in her purse. I asked her to call today so she can work on I plan to quit smoking completely. I encouraged her to set a quit date.

## 2011-07-01 NOTE — Progress Notes (Signed)
Addended by: Mariea Clonts D on: 07/01/2011 11:07 AM   Modules accepted: Orders

## 2011-07-01 NOTE — Assessment & Plan Note (Signed)
She has been taking her Valtrex maybe one tablet every 3 days if she feels any "itching". I have instructed her to take it as written on the bottle, 1 twice daily for 5 days starting at the first signs of a true outbreak of genital herpes.

## 2011-07-01 NOTE — Progress Notes (Signed)
  Subjective:    Patient ID: Kara Munoz, female    DOB: 1956-07-05, 55 y.o.   MRN: [id number]  HPI Kara Munoz is in for her routine visit. She denies missing any doses of her Truvada or Kaletra since her last visit. She is down to smoking one cigarette every few days but has not set a quit date and has not called the quit line. She states that she has not been sleeping well for the last week. She has had trouble with her truck and cannot afford to have it repaired. She believes that the stress of having problems with transportation is affecting her sleep. She denies feeling depressed. She rarely drinks any coffee or other caffeinated drinks but drinks a lot of Kool-Aid every day.    Review of Systems     Objective:   Physical Exam  Constitutional: She appears well-developed and well-nourished. No distress.       Her weight is up about 10 pounds.  HENT:  Mouth/Throat: Oropharynx is clear and moist.       She is edentulous with full dentures.  Eyes: Conjunctivae are normal.  Cardiovascular: Normal rate, regular rhythm and normal heart sounds.   Pulmonary/Chest: Breath sounds normal. She has no wheezes. She has no rales.  Abdominal: Soft. Bowel sounds are normal. There is no tenderness.  Skin: No rash noted.  Psychiatric: She has a normal mood and affect.          Assessment & Plan:

## 2011-07-02 ENCOUNTER — Telehealth: Payer: Self-pay | Admitting: *Deleted

## 2011-07-02 ENCOUNTER — Encounter: Payer: Self-pay | Admitting: Internal Medicine

## 2011-07-02 LAB — T-HELPER CELL (CD4) - (RCID CLINIC ONLY)
CD4 % Helper T Cell: 23 % — ABNORMAL LOW (ref 33–55)
CD4 T Cell Abs: 500 uL (ref 400–2700)

## 2011-07-02 NOTE — Telephone Encounter (Signed)
Pt requested that lab results be faxed to Jonette Mate, Case Manager, [facility] in [place] when completed.  Tel # [phone], Fax# [phone].  [name], RN

## 2011-07-02 NOTE — Progress Notes (Signed)
Addended by: [name] D on: 07/02/2011 09:47 AM   Modules accepted: Orders

## 2011-07-03 LAB — HIV-1 RNA QUANT-NO REFLEX-BLD
HIV 1 RNA Quant: <20 copies/mL (ref <20)
HIV-1 RNA Quant, Log: <1.3 {Log} (ref <1.30)

## 2011-07-30 LAB — T-HELPER CELL (CD4) - (RCID CLINIC ONLY): CD4 % Helper T Cell: 13 — ABNORMAL LOW

## 2011-09-19 ENCOUNTER — Telehealth: Payer: Self-pay | Admitting: *Deleted

## 2011-09-19 NOTE — Telephone Encounter (Signed)
States she went to ED & was diagnosed with bronchitis. They gave her a Zpack, tessalon pearls & hydrocodone. Pt is concerned she might get a yeast infection from the antibiotics. Wanted diflucan called in. Told her we have no mds here today. Asked her to call next week if she develops any symptoms of this. She agreed

## 2011-09-22 ENCOUNTER — Telehealth: Payer: Self-pay | Admitting: *Deleted

## 2011-09-22 DIAGNOSIS — B49 Unspecified mycosis: Secondary | ICD-10-CM

## 2011-09-22 MED ORDER — FLUCONAZOLE 150 MG PO TABS: 150.0000 mg | ORAL_TABLET | Freq: Once | ORAL | Status: AC

## 2011-09-22 NOTE — Telephone Encounter (Signed)
States she has vaginal itching from the antibiotics, wants diflucan called in. md not here until tomorrow. Told her I will ask another md to rx it

## 2011-09-22 NOTE — Telephone Encounter (Signed)
I sent the rx in & left her a message that her request has done

## 2011-10-29 ENCOUNTER — Telehealth: Payer: Self-pay | Admitting: *Deleted

## 2011-10-29 NOTE — Telephone Encounter (Signed)
States her GYN is sending her to Dr. Clifton James, an oncologist at 11am today. She wants to do another procedure on her to see "if my cancer is back". She is very anxious. Called a friend to see if she will come with her to the appt today. Wants her md here to know what is going on. I asked her to sign a release so Dr. Clifton James will fax [place] notes of today's visit. Told her to tell md about her anxiety as well. Told her if that md needs anything from [place], she can sign a release & we can fax or have md here paged. She was ok with this & had no further questions

## 2011-11-19 ENCOUNTER — Other Ambulatory Visit: Payer: Self-pay | Admitting: *Deleted

## 2011-11-19 DIAGNOSIS — B20 Human immunodeficiency virus [HIV] disease: Secondary | ICD-10-CM

## 2011-11-19 MED ORDER — EMTRICITABINE-TENOFOVIR DF 200-300 MG PO TABS: 1.0000 | ORAL_TABLET | Freq: Every day | ORAL | Status: DC

## 2011-11-19 MED ORDER — LOPINAVIR-RITONAVIR 200-50 MG PO TABS: 4.0000 | ORAL_TABLET | Freq: Every day | ORAL | Status: DC

## 2011-12-25 ENCOUNTER — Other Ambulatory Visit: Payer: Self-pay | Admitting: *Deleted

## 2011-12-25 DIAGNOSIS — B20 Human immunodeficiency virus [HIV] disease: Secondary | ICD-10-CM

## 2011-12-25 MED ORDER — LOPINAVIR-RITONAVIR 200-50 MG PO TABS: 4.0000 | ORAL_TABLET | Freq: Every day | ORAL | Status: DC

## 2011-12-25 MED ORDER — EMTRICITABINE-TENOFOVIR DF 200-300 MG PO TABS: 1.0000 | ORAL_TABLET | Freq: Every day | ORAL | Status: DC

## 2011-12-30 ENCOUNTER — Encounter: Payer: Self-pay | Admitting: Internal Medicine

## 2011-12-30 ENCOUNTER — Telehealth: Payer: Self-pay | Admitting: *Deleted

## 2011-12-30 ENCOUNTER — Ambulatory Visit (INDEPENDENT_AMBULATORY_CARE_PROVIDER_SITE_OTHER): Payer: Medicare Other | Admitting: Internal Medicine

## 2011-12-30 VITALS — BP 120/65 | HR 69 | Temp 97.8°F | Ht 69.0 in | Wt 132.0 lb

## 2011-12-30 DIAGNOSIS — B20 Human immunodeficiency virus [HIV] disease: Secondary | ICD-10-CM

## 2011-12-30 DIAGNOSIS — Z113 Encounter for screening for infections with a predominantly sexual mode of transmission: Secondary | ICD-10-CM

## 2011-12-30 DIAGNOSIS — Z79899 Other long term (current) drug therapy: Secondary | ICD-10-CM

## 2011-12-30 LAB — COMPREHENSIVE METABOLIC PANEL
ALT: 16 U/L (ref 0–35)
Albumin: 4.1 g/dL (ref 3.5–5.2)
CO2: 28 mEq/L (ref 19–32)
Glucose, Bld: 92 mg/dL (ref 70–99)
Potassium: 4.5 mEq/L (ref 3.5–5.3)
Sodium: 143 mEq/L (ref 135–145)
Total Protein: 6.6 g/dL (ref 6.0–8.3)

## 2011-12-30 LAB — LIPID PANEL
Cholesterol: 203 mg/dL — ABNORMAL HIGH (ref 0–200)
LDL Cholesterol: 101 mg/dL — ABNORMAL HIGH (ref 0–99)
Triglycerides: 367 mg/dL — ABNORMAL HIGH (ref <150)

## 2011-12-30 LAB — CBC
HCT: 39.5 % (ref 36.0–46.0)
Hemoglobin: 12.7 g/dL (ref 12.0–15.0)
MCH: 28.5 pg (ref 26.0–34.0)
MCHC: 32.2 g/dL (ref 30.0–36.0)
MCV: 88.8 fL (ref 78.0–100.0)
Platelets: 320 K/uL (ref 150–400)
RBC: 4.45 MIL/uL (ref 3.87–5.11)
RDW: 13.5 % (ref 11.5–15.5)
WBC: 6.2 K/uL (ref 4.0–10.5)

## 2011-12-30 LAB — SYPHILIS: RPR W/REFLEX TO RPR TITER AND TREPONEMAL ANTIBODIES, TRADITIONAL SCREENING AND DIAGNOSIS ALGORITHM

## 2011-12-30 MED ORDER — ENSURE PO LIQD: 1.0000 | Freq: Every day | ORAL | Status: DC

## 2011-12-30 NOTE — Telephone Encounter (Signed)
Spoke with patient's case manager Conard Novak, [phone] in [facility] and let her know that patient is having issues with depression and would like to be referred to a therapist. She is going to see what options are available that take [employer].  There is also [facility] facility if she would be interested in going there. Wendall Mola CMA

## 2011-12-30 NOTE — Progress Notes (Signed)
Patient ID: Kara Munoz, female   DOB: Dec 20, 1955, 56 y.o.   MRN: [id number]  INFECTIOUS DISEASE PROGRESS NOTE    Subjective: Kara Munoz is in for her routine visit. She states that she has been having a very difficult time recently and has been more depressed. A very close friend died of "full-blown AIDS" in early December and the Ghadeer states that she fell off the wagon and got very drunk that night. She states that she thought about using cocaine but did not. She's also been struggling with the fact that a close cousin is actively dying of cancer. She has not been back in therapy for her depression. She is also been struggling with bronchitis and was in the hospital at [facility] for one night in early December. Her primary care physician in [place] recently started her back on penicillin for bronchitis. She states that she is feeling a little bit better. She states that she has cut down on her cigarettes and she has been sick but does not have a plan to quit. She does not recall missing any of her HIV medications.  Objective: Temp: 97.8 F (36.6 C) (03/19 1022) Temp src: Oral (03/19 1022) BP: 120/65 mmHg (03/19 1022) Pulse Rate: 69  (03/19 1022)  General: She is tearful Skin: Her mouth and throat are clear and she has no rash Lungs: Clear Cor: Regular S1 and S2 no murmurs Abdomen: Soft and nontender   Lab Results HIV 1 RNA Quant (copies/mL)  Date Value  07/01/2011 <20   09/17/2010 <20 copies/mL   12/25/2009 <48 copies/mL      CD4 T Cell Abs (cmm)  Date Value  07/01/2011 500   09/17/2010 380*  12/25/2009 330*     Assessment: It sounds as though her adherence remains very good in her infection is probably still under good control but I will repeat a full set of lab work today. I've encouraged her to talk with her case manager and try to arrange counseling for her depression. He also asked her to consider cigarette cessation counseling.  Plan: 1. Continue current HIV  medications 2. Check lab work today 3. Attempt to arrange counseling for depression 4. Cigarette cessation counseling provided 5. Return to clinic in 3 months   Cliffton Asters, MD [facility] for Infectious Diseases [facility] [phone] pager   [phone] cell 12/30/2011, 12:29 PM

## 2011-12-31 LAB — T-HELPER CELL (CD4) - (RCID CLINIC ONLY)
CD4 % Helper T Cell: 24 % — ABNORMAL LOW (ref 33–55)
CD4 T Cell Abs: 590 uL (ref 400–2700)

## 2012-01-01 LAB — HIV-1 RNA QUANT-NO REFLEX-BLD
HIV 1 RNA Quant: 64 copies/mL — ABNORMAL HIGH (ref <20)
HIV-1 RNA Quant, Log: 1.81 {Log} — ABNORMAL HIGH (ref <1.30)

## 2012-01-21 ENCOUNTER — Other Ambulatory Visit: Payer: Self-pay | Admitting: *Deleted

## 2012-01-21 DIAGNOSIS — B20 Human immunodeficiency virus [HIV] disease: Secondary | ICD-10-CM

## 2012-01-21 MED ORDER — EMTRICITABINE-TENOFOVIR DF 200-300 MG PO TABS: 1.0000 | ORAL_TABLET | Freq: Every day | ORAL | Status: DC

## 2012-01-21 MED ORDER — LOPINAVIR-RITONAVIR 200-50 MG PO TABS: 4.0000 | ORAL_TABLET | Freq: Every day | ORAL | Status: DC

## 2012-03-23 ENCOUNTER — Other Ambulatory Visit: Payer: Medicare Other

## 2012-04-06 ENCOUNTER — Ambulatory Visit (INDEPENDENT_AMBULATORY_CARE_PROVIDER_SITE_OTHER): Payer: Medicare Other | Admitting: Internal Medicine

## 2012-04-06 ENCOUNTER — Encounter: Payer: Self-pay | Admitting: Internal Medicine

## 2012-04-06 VITALS — BP 101/72 | HR 67 | Temp 97.5°F | Ht 66.5 in | Wt 130.5 lb

## 2012-04-06 DIAGNOSIS — B20 Human immunodeficiency virus [HIV] disease: Secondary | ICD-10-CM

## 2012-04-06 NOTE — Progress Notes (Signed)
Patient ID: Kara Munoz, female   DOB: [birth year], 56 y.o.   MRN: [id number]     [facility] for Infectious Disease  Patient Active Problem List  Diagnosis  . HIV DISEASE  . GENITAL HERPES  . HERPES SIMPLEX KERATITIS  . WARTS, OTHER SPECIFIED VIRAL  . SYPHILIS  . DYSLIPIDEMIA  . ANEMIA-NOS  . ANXIETY  . CIGARETTE SMOKER  . SUBSTANCE ABUSE, MULTIPLE  . DEPRESSION  . GLAUCOMA  . PNEUMONIA, RECURRENT  . DENTAL CARIES  . DYSPLASIA, CERVIX NOS  . HYPERTENSION NEC  . APPENDECTOMY, HX OF    Patient's Medications  New Prescriptions   No medications on file  Previous Medications   EMTRICITABINE-TENOFOVIR (TRUVADA) 200-300 MG PER TABLET    Take 1 tablet by mouth daily.   ENSURE (ENSURE)    Take 1 Can by mouth daily.   IBUPROFEN (ADVIL,MOTRIN) 800 MG TABLET    Take 800 mg by mouth every 8 (eight) hours as needed.     LOPINAVIR-RITONAVIR (KALETRA) 200-50 MG PER TABLET    Take 4 tablets by mouth daily.   TRAVOPROST, BENZALKONIUM, (TRAVATAN) 0.004 % OPHTHALMIC SOLUTION    Place 1 drop into both eyes at bedtime.     VALACYCLOVIR (VALTREX) 500 MG TABLET    Take 500 mg by mouth 2 (two) times daily. Take one by mouth twice daily for 5 days as needed.  Modified Medications   No medications on file  Discontinued Medications   OLOPATADINE (PATANOL) 0.1 % OPHTHALMIC SOLUTION    as directed.     PENICILLIN V POTASSIUM (VEETID) 250 MG TABLET    Take 250 mg by mouth 4 (four) times daily.    Subjective: Kara Munoz is in for her routine visit. She is recovering nicely from her recent laparoscopic hysterectomy. She has not missed any doses of her Truvada or Kaletra. She has a visit with her primary care physician, Dr. Lum Babe, tomorrow.  Objective: Temp: 97.5 F (36.4 C) (06/25 1029) Temp src: Oral (06/25 1029) BP: 101/72 mmHg (06/25 1029) Pulse Rate: 67  (06/25 1029)  General: She is in good spirits Skin: No rash Lungs: Clear Cor: Regular S1 and S2 no murmurs Abdomen: Her laparoscopic  incisions are healing nicely. She has some periumbilical bruising.   Lab Results HIV 1 RNA Quant (copies/mL)  Date Value  12/30/2011 64*  07/01/2011 <20   09/17/2010 <20 copies/mL      CD4 T Cell Abs (cmm)  Date Value  12/30/2011 590   07/01/2011 500   09/17/2010 380*     Assessment: Her HIV infection is under better control than it has been in decades. Her creatinine had risen to 1.35. I will recheck this in 3 months. If her renal function continues to deteriorate I will change the dosing of her Truvada.  Plan: 1. Continue current antiretroviral regimen 2. Followup after lab work in 3 months   Cliffton Asters, MD [facility] for Infectious Disease [facility] [phone] pager   [phone] cell 04/06/2012, 10:47 AM

## 2012-06-02 ENCOUNTER — Telehealth: Payer: Self-pay | Admitting: *Deleted

## 2012-06-02 NOTE — Telephone Encounter (Signed)
RN spoke with the pt.  She had blood work prior to having a colonoscopy yesterday.  Creatinine 1.24.  Pt now has a PCP, Dr. Zena Amos [phone]), fax [phone].  RN advised pt to have the Colonoscopy office fax lab results to PCP office.  Pt to bring her copy of results to next OV w/ Dr. Orvan Falconer.  Pt verbalized understanding of this conversation and advice.

## 2012-07-08 ENCOUNTER — Encounter: Payer: Self-pay | Admitting: Internal Medicine

## 2012-07-08 ENCOUNTER — Ambulatory Visit (INDEPENDENT_AMBULATORY_CARE_PROVIDER_SITE_OTHER): Payer: Medicare Other | Admitting: Internal Medicine

## 2012-07-08 VITALS — BP 121/76 | HR 58 | Temp 97.3°F | Ht 66.0 in | Wt 135.5 lb

## 2012-07-08 DIAGNOSIS — Z23 Encounter for immunization: Secondary | ICD-10-CM

## 2012-07-08 DIAGNOSIS — B20 Human immunodeficiency virus [HIV] disease: Secondary | ICD-10-CM

## 2012-07-08 LAB — COMPREHENSIVE METABOLIC PANEL
AST: 22 U/L (ref 0–37)
Albumin: 4.2 g/dL (ref 3.5–5.2)
Alkaline Phosphatase: 75 U/L (ref 39–117)
BUN: 19 mg/dL (ref 6–23)
Glucose, Bld: 85 mg/dL (ref 70–99)
Potassium: 4.5 mEq/L (ref 3.5–5.3)
Sodium: 142 mEq/L (ref 135–145)
Total Bilirubin: 0.7 mg/dL (ref 0.3–1.2)

## 2012-07-08 NOTE — Progress Notes (Signed)
Patient ID: Kara Munoz, female   DOB: 1956-02-19, 56 y.o.   MRN: [id number]     [facility] for Infectious Disease  Patient Active Problem List  Diagnosis  . HIV DISEASE  . GENITAL HERPES  . HERPES SIMPLEX KERATITIS  . WARTS, OTHER SPECIFIED VIRAL  . SYPHILIS  . DYSLIPIDEMIA  . ANEMIA-NOS  . ANXIETY  . CIGARETTE SMOKER  . SUBSTANCE ABUSE, MULTIPLE  . DEPRESSION  . GLAUCOMA  . PNEUMONIA, RECURRENT  . DENTAL CARIES  . DYSPLASIA, CERVIX NOS  . HYPERTENSION NEC  . APPENDECTOMY, HX OF    Patient's Medications  New Prescriptions   No medications on file  Previous Medications   ALPRAZOLAM (XANAX) 1 MG TABLET    Take 1 mg by mouth at bedtime as needed.   EMTRICITABINE-TENOFOVIR (TRUVADA) 200-300 MG PER TABLET    Take 1 tablet by mouth daily.   ENSURE (ENSURE)    Take 1 Can by mouth daily.   FISH OIL-OMEGA-3 FATTY ACIDS 1000 MG CAPSULE    Take 1 g by mouth daily.   IBUPROFEN (ADVIL,MOTRIN) 800 MG TABLET    Take 800 mg by mouth every 8 (eight) hours as needed.     LOPINAVIR-RITONAVIR (KALETRA) 200-50 MG PER TABLET    Take 4 tablets by mouth daily.   PRAVASTATIN (PRAVACHOL) 20 MG TABLET    Take 20 mg by mouth daily.   TRAVOPROST, BENZALKONIUM, (TRAVATAN) 0.004 % OPHTHALMIC SOLUTION    Place 1 drop into both eyes at bedtime.     VALACYCLOVIR (VALTREX) 500 MG TABLET    Take 500 mg by mouth 2 (two) times daily. Take one by mouth twice daily for 5 days as needed.  Modified Medications   No medications on file  Discontinued Medications   No medications on file    Subjective: Leesa is in for her routine visit. As usual she denies missing any doses of her Truvada or Kaletra since her last visit. She does state that she has been feeling slightly more anxious and depressed since her cousin died. When she feels especially down she will try to get out and do more walking and be with family. She is grown closer to her sister and finds that that helps as well as working with her  granddaughter. She continues to smoke cigarettes but states that she can often go 2 days without smoking and usually only smokes in the morning after breakfast.  Objective: Temp: 97.3 F (36.3 C) (09/26 0858) Temp src: Oral (09/26 0858) BP: 121/76 mmHg (09/26 0858) Pulse Rate: 58  (09/26 0858)  General: She is in good spirits Skin: No rash Lungs: Clear Cor: Regular S1 and S2 no murmurs Abdomen: Soft and nontender She does not appear to be depressed  Lab Results HIV 1 RNA Quant (copies/mL)  Date Value  12/30/2011 64*  07/01/2011 <20   09/17/2010 <20 copies/mL      CD4 T Cell Abs (cmm)  Date Value  12/30/2011 590   07/01/2011 500   09/17/2010 380*     Assessment: Her HIV infection has been under very good control and her adherence has been much improved over the last 5-6 years. I will repeat her lab work today and continue her current antiretroviral regimen.  She has chronic anxiety and depression but has learned to cope with it and much more positive ways than in the past. She knows to call if she is feeling more depressed between now and her next visit.  I talked to  her again about the importance of quitting cigarettes completely. Have given her written information about the [place] quit line.  Plan: 1. Continue Truvada and Kaletra 2. Check lab work today including creatinine. If her creatinine is up more on may need to consider renal he does seem the components of Truvada. 3. Cigarette cessation counseling 4. Followup after lab work in 6 months.    Cliffton Asters, MD [facility] for Infectious Disease [facility] [phone] pager   [phone] cell 07/08/2012, 9:32 AM

## 2012-07-09 LAB — T-HELPER CELL (CD4) - (RCID CLINIC ONLY): CD4 % Helper T Cell: 25 % — ABNORMAL LOW (ref 33–55)

## 2012-07-09 LAB — HIV-1 RNA QUANT-NO REFLEX-BLD: HIV 1 RNA Quant: <20 copies/mL (ref <20)

## 2012-09-14 ENCOUNTER — Other Ambulatory Visit: Payer: Self-pay | Admitting: Internal Medicine

## 2012-09-20 ENCOUNTER — Other Ambulatory Visit: Payer: Self-pay | Admitting: *Deleted

## 2012-09-20 DIAGNOSIS — B20 Human immunodeficiency virus [HIV] disease: Secondary | ICD-10-CM

## 2012-09-20 MED ORDER — EMTRICITABINE-TENOFOVIR DF 200-300 MG PO TABS: 1.0000 | ORAL_TABLET | Freq: Every day | ORAL | Status: DC

## 2012-10-22 ENCOUNTER — Other Ambulatory Visit: Payer: Self-pay | Admitting: Internal Medicine

## 2012-10-25 ENCOUNTER — Other Ambulatory Visit: Payer: Self-pay | Admitting: *Deleted

## 2012-10-25 DIAGNOSIS — B20 Human immunodeficiency virus [HIV] disease: Secondary | ICD-10-CM

## 2012-10-25 MED ORDER — EMTRICITABINE-TENOFOVIR DF 200-300 MG PO TABS: 1.0000 | ORAL_TABLET | Freq: Every day | ORAL | Status: DC

## 2012-10-25 MED ORDER — LOPINAVIR-RITONAVIR 200-50 MG PO TABS: 2.0000 | ORAL_TABLET | Freq: Two times a day (BID) | ORAL | Status: DC

## 2012-11-23 ENCOUNTER — Other Ambulatory Visit: Payer: Self-pay | Admitting: Internal Medicine

## 2012-11-24 ENCOUNTER — Telehealth: Payer: Self-pay | Admitting: *Deleted

## 2012-11-24 NOTE — Telephone Encounter (Signed)
Patient called and advised she is switching the pharmacy she gets her HIV medications from to [facility] [phone]. Advised her added it to her profile and she needs to call about 7 days before she runs out of meds and we will send them to the new pharmacy.

## 2012-12-27 ENCOUNTER — Other Ambulatory Visit: Payer: Self-pay | Admitting: *Deleted

## 2012-12-27 DIAGNOSIS — B20 Human immunodeficiency virus [HIV] disease: Secondary | ICD-10-CM

## 2012-12-27 MED ORDER — LOPINAVIR-RITONAVIR 200-50 MG PO TABS: 2.0000 | ORAL_TABLET | Freq: Two times a day (BID) | ORAL | Status: DC

## 2012-12-27 MED ORDER — EMTRICITABINE-TENOFOVIR DF 200-300 MG PO TABS: 1.0000 | ORAL_TABLET | Freq: Every day | ORAL | Status: DC

## 2012-12-27 NOTE — Progress Notes (Signed)
RX sent to new pharmacy. Andree Coss, RN

## 2013-01-04 ENCOUNTER — Encounter: Payer: Self-pay | Admitting: Internal Medicine

## 2013-01-04 ENCOUNTER — Ambulatory Visit (INDEPENDENT_AMBULATORY_CARE_PROVIDER_SITE_OTHER): Payer: Medicare Other | Admitting: Internal Medicine

## 2013-01-04 VITALS — BP 120/78 | HR 71 | Temp 98.3°F | Ht 66.0 in | Wt 130.0 lb

## 2013-01-04 DIAGNOSIS — B20 Human immunodeficiency virus [HIV] disease: Secondary | ICD-10-CM

## 2013-01-04 DIAGNOSIS — A6000 Herpesviral infection of urogenital system, unspecified: Secondary | ICD-10-CM

## 2013-01-04 DIAGNOSIS — Z23 Encounter for immunization: Secondary | ICD-10-CM

## 2013-01-04 DIAGNOSIS — Z79899 Other long term (current) drug therapy: Secondary | ICD-10-CM

## 2013-01-04 LAB — COMPREHENSIVE METABOLIC PANEL
ALT: 23 U/L (ref 0–35)
Albumin: 4.7 g/dL (ref 3.5–5.2)
CO2: 27 mEq/L (ref 19–32)
Calcium: 10.3 mg/dL (ref 8.4–10.5)
Chloride: 107 mEq/L (ref 96–112)
Glucose, Bld: 91 mg/dL (ref 70–99)
Potassium: 4.5 mEq/L (ref 3.5–5.3)
Sodium: 140 mEq/L (ref 135–145)
Total Bilirubin: 0.7 mg/dL (ref 0.3–1.2)
Total Protein: 6.9 g/dL (ref 6.0–8.3)

## 2013-01-04 LAB — LIPID PANEL
Cholesterol: 209 mg/dL — ABNORMAL HIGH (ref 0–200)
Triglycerides: 196 mg/dL — ABNORMAL HIGH (ref <150)
VLDL: 39 mg/dL (ref 0–40)

## 2013-01-04 LAB — CBC
Hemoglobin: 13.7 g/dL (ref 12.0–15.0)
MCH: 27.7 pg (ref 26.0–34.0)
Platelets: 254 10*3/uL (ref 150–400)
RBC: 4.95 MIL/uL (ref 3.87–5.11)
WBC: 5.8 10*3/uL (ref 4.0–10.5)

## 2013-01-04 MED ORDER — PRAVASTATIN SODIUM 20 MG PO TABS: 20.0000 mg | ORAL_TABLET | Freq: Every day | ORAL | Status: DC

## 2013-01-04 MED ORDER — RITONAVIR 100 MG PO TABS: 100.0000 mg | ORAL_TABLET | Freq: Every day | ORAL | Status: DC

## 2013-01-04 MED ORDER — ATAZANAVIR SULFATE 300 MG PO CAPS: 300.0000 mg | ORAL_CAPSULE | Freq: Every day | ORAL | Status: DC

## 2013-01-04 MED ORDER — VALACYCLOVIR HCL 500 MG PO TABS: 500.0000 mg | ORAL_TABLET | Freq: Two times a day (BID) | ORAL | Status: DC

## 2013-01-04 NOTE — Progress Notes (Signed)
HPI: Kara Munoz is a 57 y.o. female with HIV how is here for routine follow up.   Allergies: No Known Allergies  Vitals: Temp: 98.3 F (36.8 C) (03/25 0903) Temp src: Oral (03/25 0903) BP: 120/78 mmHg (03/25 0903) Pulse Rate: 71 (03/25 0903)  Past Medical History: No past medical history on file.  Social History: History   Social History  . Marital Status: Divorced    Spouse Name: N/A    Number of Children: N/A  . Years of Education: N/A   Social History Main Topics  . Smoking status: Current Every Day Smoker -- 0.20 packs/day for 35 years    Types: Cigarettes  . Smokeless tobacco: Never Used     Comment: using "E" cigarettes  . Alcohol Use: No  . Drug Use: No  . Sexually Active: Not Currently     Comment: given condoms for grandchild   Other Topics Concern  . None   Social History Narrative  . None    Previous Regimen: Kaletra + truvada  Current Regimen: Kaletra + truvada  Labs: HIV 1 RNA Quant (copies/mL)  Date Value  07/08/2012 <20   12/30/2011 64*  07/01/2011 <20      CD4 T Cell Abs (cmm)  Date Value  07/08/2012 560   12/30/2011 590   07/01/2011 500      Hep B S Ab (no units)  Date Value  12/07/2006 No      Hepatitis B Surface Ag (no units)  Date Value  12/07/2006 No      HCV Ab (no units)  Date Value  12/07/2006 NO     CrCl: Estimated Creatinine Clearance: 42 ml/min (by C-G formula based on Cr of 1.36).  Lipids:    Component Value Date/Time   CHOL 203* 12/30/2011 1056   TRIG 367* 12/30/2011 1056   HDL 29* 12/30/2011 1056   CHOLHDL 7.0 12/30/2011 1056   VLDL 73* 12/30/2011 1056   LDLCALC 101* 12/30/2011 1056    Assessment: 57 yo with well controlled HIV. Her compliance has been good as her previous labs looked good. She does have a hx of hyperlipidemia likely related to [place]. D/w with dr. Orvan Falconer about changing her to a once daily boosted PI that will simplify her regimen and have lesser effects on her lipids.    Recommendations: Change to ATV/r + Truvada Recheck labs  Clide Cliff, PharmD Clinical Infectious Disease Pharmacist [facility] for Infectious Disease 01/04/2013, 2:53 PM

## 2013-01-04 NOTE — Progress Notes (Signed)
Patient ID: Kara Munoz, female   DOB: 05-Sep-1956, 57 y.o.   MRN: [id number]          [facility] for Infectious Disease  Patient Active Problem List  Diagnosis  . HIV DISEASE  . GENITAL HERPES  . HERPES SIMPLEX KERATITIS  . WARTS, OTHER SPECIFIED VIRAL  . SYPHILIS  . DYSLIPIDEMIA  . ANEMIA-NOS  . ANXIETY  . CIGARETTE SMOKER  . SUBSTANCE ABUSE, MULTIPLE  . DEPRESSION  . GLAUCOMA  . PNEUMONIA, RECURRENT  . DENTAL CARIES  . DYSPLASIA, CERVIX NOS  . HYPERTENSION NEC  . APPENDECTOMY, HX OF    Patient's Medications  New Prescriptions   ATAZANAVIR (REYATAZ) 300 MG CAPSULE    Take 1 capsule (300 mg total) by mouth daily with breakfast.   RITONAVIR (NORVIR) 100 MG TABS    Take 1 tablet (100 mg total) by mouth daily.  Previous Medications   EMTRICITABINE-TENOFOVIR (TRUVADA) 200-300 MG PER TABLET    Take 1 tablet by mouth daily.   FISH OIL-OMEGA-3 FATTY ACIDS 1000 MG CAPSULE    Take 1 g by mouth daily.   IBUPROFEN (ADVIL,MOTRIN) 800 MG TABLET    Take 800 mg by mouth every 8 (eight) hours as needed.     OLOPATADINE HCL (PATADAY) 0.2 % SOLN    Apply to eye.   PRAVASTATIN (PRAVACHOL) 20 MG TABLET    Take 20 mg by mouth daily.   TRAVOPROST, BENZALKONIUM, (TRAVATAN) 0.004 % OPHTHALMIC SOLUTION    Place 1 drop into both eyes at bedtime.     VALACYCLOVIR (VALTREX) 500 MG TABLET    Take 500 mg by mouth 2 (two) times daily. Take one by mouth twice daily for 5 days as needed.  Modified Medications   No medications on file  Discontinued Medications   ALPRAZOLAM (XANAX) 1 MG TABLET    Take 1 mg by mouth at bedtime as needed.   LOPINAVIR-RITONAVIR (KALETRA) 200-50 MG PER TABLET    Take 2 tablets by mouth 2 (two) times daily.    Subjective: Kara Munoz is in for her routine visit. He is missed only one dose of her HIV medications since her last visit when the pharmacy was late delivering it. She continues to smoke cigarettes but recently bought an E cigarette in an effort to try to  quit.  Objective: Temp: 98.3 F (36.8 C) (03/25 0903) Temp src: Oral (03/25 0903) BP: 120/78 mmHg (03/25 0903) Pulse Rate: 71 (03/25 0903)  General: she is well dressed and in good spirits Skin: no rash Lungs: clear Cor: regular S1 and S2 no murmurs Abdomen: soft and nontender   Lab Results HIV 1 RNA Quant (copies/mL)  Date Value  07/08/2012 <20   12/30/2011 64*  07/01/2011 <20      CD4 T Cell Abs (cmm)  Date Value  07/08/2012 560   12/30/2011 590   07/01/2011 500    BMET    Component Value Date/Time   NA 142 07/08/2012 0955   K 4.5 07/08/2012 0955   CL 107 07/08/2012 0955   CO2 24 07/08/2012 0955   GLUCOSE 85 07/08/2012 0955   BUN 19 07/08/2012 0955   CREATININE 1.36* 07/08/2012 0955   CREATININE 1.38* 12/25/2009 1917   CALCIUM 10.3 07/08/2012 0955   Lab Results  Component Value Date   CHOL 203* 12/30/2011   HDL 29* 12/30/2011   LDLCALC 101* 12/30/2011   TRIG 367* 12/30/2011   CHOLHDL 7.0 12/30/2011     Assessment: She will need repeat lab  work today but her adherence remains excellent and I suspect that her infection is well controlled. I will simplify and improve her antiretroviral regimen by changing her Kaletra to boosted Reyataz. This will decrease her pill burden and also improve her lipid profile. Will repeat her creatinine today to make sure that the dosing of her Truvada is correct. I talked to her about the importance of cigarette cessation.  Plan: 1. Continue Truvada and change Kaletra to boosted Reyataz 2. Check lab work today 3. Cigarette cessation counseling provided 4. Followup in 6 months   Cliffton Asters, MD [facility] for [facility] [phone] pager   [phone] cell 01/04/2013, 9:39 AM

## 2013-01-04 NOTE — Addendum Note (Signed)
Addended by: [name] D on: 01/04/2013 09:47 AM   Modules accepted: Orders

## 2013-01-05 LAB — HIV-1 RNA QUANT-NO REFLEX-BLD
HIV 1 RNA Quant: <20 copies/mL (ref <20)
HIV-1 RNA Quant, Log: <1.3 {Log} (ref <1.30)

## 2013-01-05 LAB — T-HELPER CELL (CD4) - (RCID CLINIC ONLY): CD4 T Cell Abs: 480 uL (ref 400–2700)

## 2013-03-25 ENCOUNTER — Other Ambulatory Visit: Payer: Self-pay | Admitting: Internal Medicine

## 2013-03-25 DIAGNOSIS — B20 Human immunodeficiency virus [HIV] disease: Secondary | ICD-10-CM

## 2013-04-18 ENCOUNTER — Ambulatory Visit: Payer: Medicare Other

## 2013-04-18 DIAGNOSIS — R630 Anorexia: Secondary | ICD-10-CM

## 2013-04-18 NOTE — Progress Notes (Signed)
Medical Nutrition Therapy:  Appt start time: 0930 end time:  1030.  Assessment:  Primary concerns today: loss of appetite.   MEDICATIONS: see list.   DIETARY INTAKE:  Usual eating pattern includes 2 meals and 1-3 snacks per day.  Everyday foods include breakfast foods, supplement drink.  Avoided foods include none noted.    24-hr recall:  B ( AM): sausage, bacon, or country ham. Used to just be corn flakes  Snk ( AM): fruit  L ( PM): meals on wheels for lunch. Sometimes will not eat it if she doesn't want it, instead will have a PB and J sandwich. Snk ( PM): potato chips, cookies D ( PM): doesn't eat dinner. May drink a boost or an ensure. Occasionally a sandwich. Rarely a burger. Snk ( PM): candy, potato chips Beverages: OJ in morning, milk- one tall glass in morning, mostly water, grape soda. No soda when she has a boost or ensure.  Pt claims to enjoy a large breakfast and something for lunch, but very little interest in eating much dinner.  Usual physical activity: used to walk, but does not currently. Not much in active hobbies.   Progress Towards Goal(s):  In progress.   Nutritional Diagnosis:  NB-2.4 Impaired ability to prepare foods/meals As related to loss of appetite, limited access, need for home food delivery.  As evidenced by pt claims of same, pt stated struggles to maintain weight, pt recipient of meals on wheels.    Intervention:  Nutrition counseling provided for High Calorie, High Protein Nutrition therapy. RD also recommends Boost or Ensure as available up to BID. This recommendation good for 12 months. Rx for same provided for pt social worker's office.  Handouts given during visit include:  High Protein, High Calorie Nutrition Therapy  Monitoring/Evaluation:  Dietary intake, exercise, and body weight prn.

## 2013-07-07 ENCOUNTER — Ambulatory Visit (INDEPENDENT_AMBULATORY_CARE_PROVIDER_SITE_OTHER): Payer: Medicare Other | Admitting: Internal Medicine

## 2013-07-07 ENCOUNTER — Encounter: Payer: Self-pay | Admitting: Internal Medicine

## 2013-07-07 VITALS — BP 131/85 | HR 70 | Temp 97.9°F | Ht 66.0 in | Wt 130.2 lb

## 2013-07-07 DIAGNOSIS — Z113 Encounter for screening for infections with a predominantly sexual mode of transmission: Secondary | ICD-10-CM

## 2013-07-07 DIAGNOSIS — Z79899 Other long term (current) drug therapy: Secondary | ICD-10-CM

## 2013-07-07 DIAGNOSIS — Z23 Encounter for immunization: Secondary | ICD-10-CM

## 2013-07-07 DIAGNOSIS — B20 Human immunodeficiency virus [HIV] disease: Secondary | ICD-10-CM

## 2013-07-07 NOTE — Progress Notes (Signed)
Patient ID: Kara Munoz, female   DOB: 08-31-1956, 57 y.o.   MRN: [id number]          [facility] for Infectious Disease  Patient Active Problem List   Diagnosis Date Noted  . HIV DISEASE 07/23/2006    Priority: High  . DYSLIPIDEMIA 02/19/2010    Priority: Medium  . HYPERTENSION NEC 12/25/2009    Priority: Medium  . CIGARETTE SMOKER 05/09/2008    Priority: Medium  . ANXIETY 07/23/2006    Priority: Medium  . SUBSTANCE ABUSE, MULTIPLE 07/23/2006    Priority: Medium  . DEPRESSION 07/23/2006    Priority: Medium  . DYSPLASIA, CERVIX NOS 07/23/2006    Priority: Medium  . GLAUCOMA 05/09/2008  . APPENDECTOMY, HX OF 12/02/2006  . GENITAL HERPES 07/23/2006  . 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 WARTS, OTHER SPECIFIED VIRAL 07/23/2006  . SYPHILIS 07/23/2006  . ANEMIA-NOS 07/23/2006  . PNEUMONIA, RECURRENT 07/23/2006  . DENTAL CARIES 07/23/2006    Patient's Medications  New Prescriptions   No medications on file  Previous Medications   ATAZANAVIR (REYATAZ) 300 MG CAPSULE    Take 1 capsule (300 mg total) by mouth daily with breakfast.   FISH OIL-OMEGA-3 FATTY ACIDS 1000 MG CAPSULE    Take 1 g by mouth daily.   IBUPROFEN (ADVIL,MOTRIN) 800 MG TABLET    Take 800 mg by mouth every 8 (eight) hours as needed.     OLOPATADINE HCL (PATADAY) 0.2 % SOLN    Apply to eye.   PRAVASTATIN (PRAVACHOL) 20 MG TABLET    Take 1 tablet (20 mg total) by mouth daily.   RITONAVIR (NORVIR) 100 MG TABS    Take 1 tablet (100 mg total) by mouth daily.   TRAVOPROST, BENZALKONIUM, (TRAVATAN) 0.004 % OPHTHALMIC SOLUTION    Place 1 drop into both eyes at bedtime.     TRUVADA 200-300 MG PER TABLET    TAKE 1 TABLET BY MOUTH EVERY DAY   VALACYCLOVIR (VALTREX) 500 MG TABLET    Take 1 tablet (500 mg total) by mouth 2 (two) times daily. for 5 days as needed.  Modified Medications   No medications on file  Discontinued Medications   No medications on file    Subjective: Kara Munoz is in for her routine  visit. As usual, she has not missed any doses of her antiretroviral medications. She is feeling well but she is alone upset about what she perceives as lack of respect for her 75 year old granddaughter. She is just recently joined the [employer] and hopes to start exercising soon. Review of Systems: Pertinent items are noted in HPI.  No past medical history on file.  History  Substance Use Topics  . Smoking status: Current Every Day Smoker -- 0.20 packs/day for 35 years    Types: Cigarettes  . Smokeless tobacco: Never Used     Comment: using "E" cigarettes  . Alcohol Use: No    No family history on file.  No Known Allergies  Objective: Temp: 97.9 F (36.6 C) (09/25 0940) Temp src: Oral (09/25 0940) BP: 131/85 mmHg (09/25 0940) Pulse Rate: 70 (09/25 0940)  General: She is in good spirits Oral: Full dentures with no oropharyngeal lesions Skin: No rash Lungs: Clear Cor: Regular S1 and S2 no murmurs Mood and affect: Normal  Lab Results HIV 1 RNA Quant (copies/mL)  Date Value  01/04/2013 <20   07/08/2012 <20   12/30/2011 64*     CD4 T Cell Abs (cmm)  Date Value  01/04/2013 480  07/08/2012 560   12/30/2011 590      Assessment: Her HIV infection remains under excellent control.  Plan: 1. Continue current antiretroviral regimen 2. Influenza vaccination today 3. Followup after lab work in 6 months   Cliffton Asters, MD [facility] for Infectious Disease [facility] [phone] pager   [phone] cell 07/07/2013, 9:55 AM

## 2013-07-18 ENCOUNTER — Other Ambulatory Visit: Payer: Self-pay | Admitting: Internal Medicine

## 2013-07-18 DIAGNOSIS — B20 Human immunodeficiency virus [HIV] disease: Secondary | ICD-10-CM

## 2013-09-02 ENCOUNTER — Other Ambulatory Visit: Payer: Self-pay | Admitting: Internal Medicine

## 2013-10-10 ENCOUNTER — Other Ambulatory Visit: Payer: Self-pay | Admitting: Internal Medicine

## 2013-11-18 ENCOUNTER — Other Ambulatory Visit: Payer: Self-pay | Admitting: Internal Medicine

## 2013-11-18 DIAGNOSIS — B20 Human immunodeficiency virus [HIV] disease: Secondary | ICD-10-CM

## 2013-11-29 ENCOUNTER — Other Ambulatory Visit: Payer: Self-pay | Admitting: Internal Medicine

## 2013-11-29 DIAGNOSIS — B20 Human immunodeficiency virus [HIV] disease: Secondary | ICD-10-CM

## 2014-01-10 ENCOUNTER — Ambulatory Visit (INDEPENDENT_AMBULATORY_CARE_PROVIDER_SITE_OTHER): Payer: PRIVATE HEALTH INSURANCE | Admitting: Internal Medicine

## 2014-01-10 ENCOUNTER — Encounter: Payer: Self-pay | Admitting: Internal Medicine

## 2014-01-10 VITALS — BP 151/90 | HR 64 | Temp 98.3°F | Wt 132.5 lb

## 2014-01-10 DIAGNOSIS — B20 Human immunodeficiency virus [HIV] disease: Secondary | ICD-10-CM

## 2014-01-10 NOTE — Progress Notes (Signed)
Patient ID: Kara Munoz, female   DOB: 04/03/1956, 58 y.o.   MRN: [id number] @LOGODEPT         Patient Active Problem List   Diagnosis Date Noted  . HIV DISEASE 07/23/2006    Priority: High  . DYSLIPIDEMIA 02/19/2010    Priority: Medium  . HYPERTENSION NEC 12/25/2009    Priority: Medium  . CIGARETTE SMOKER 05/09/2008    Priority: Medium  . ANXIETY 07/23/2006    Priority: Medium  . SUBSTANCE ABUSE, MULTIPLE 07/23/2006    Priority: Medium  . DEPRESSION 07/23/2006    Priority: Medium  . DYSPLASIA, CERVIX NOS 07/23/2006    Priority: Medium  . GLAUCOMA 05/09/2008  . APPENDECTOMY, HX OF 12/02/2006  . GENITAL HERPES 07/23/2006  . 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 WARTS, OTHER SPECIFIED VIRAL 07/23/2006  . SYPHILIS 07/23/2006  . ANEMIA-NOS 07/23/2006  . PNEUMONIA, RECURRENT 07/23/2006  . DENTAL CARIES 07/23/2006    Patient's Medications  New Prescriptions   No medications on file  Previous Medications   FISH OIL-OMEGA-3 FATTY ACIDS 1000 MG CAPSULE    Take 1 g by mouth daily.   IBUPROFEN (ADVIL,MOTRIN) 800 MG TABLET    Take 800 mg by mouth every 8 (eight) hours as needed.     NORVIR 100 MG TABS TABLET    Take 1 tablet (100 mg total) by mouth daily.   OLOPATADINE HCL (PATADAY) 0.2 % SOLN    Apply to eye.   PRAVASTATIN (PRAVACHOL) 20 MG TABLET    Take 1 tablet (20 mg total) by mouth daily.   REYATAZ 300 MG CAPSULE    Take 1 capsule (300 mg total) by mouth daily with breakfast.   TRAVOPROST, BENZALKONIUM, (TRAVATAN) 0.004 % OPHTHALMIC SOLUTION    Place 1 drop into both eyes at bedtime.     TRUVADA 200-300 MG PER TABLET    TAKE 1 TABLET BY MOUTH EVERY DAY   VALACYCLOVIR (VALTREX) 500 MG TABLET    Take 1 tablet (500 mg total) by mouth 2 (two) times daily. for 5 days as needed.  Modified Medications   No medications on file  Discontinued Medications   REYATAZ 300 MG CAPSULE    Take 1 capsule (300 mg total) by mouth daily with breakfast.    Subjective: [name] is in for her  routine visit. She has not missed any doses of her HIV medications. 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 She is scheduled for laser therapy soon. Review of Systems: Pertinent items are noted in HPI.  No past medical history on file.  History  Substance Use Topics  . Smoking status: Current Some Day Smoker -- 0.10 packs/day for 35 years    Types: Cigarettes  . Smokeless tobacco: Never Used     Comment: using "E" cigarettes  . Alcohol Use: No    No family history on file.  No Known Allergies  Objective: Temp: 98.3 F (36.8 C) (03/31 0943) Temp src: Oral (03/31 0943) BP: 151/90 mmHg (03/31 0943) Pulse Rate: 64 (03/31 0943) Body mass index is 21.4 kg/(m^2).  General: She is in good spirits Oral: No oropharyngeal lesions Skin: No rash Lungs: Clear Cor: Regular S1 and S2 no murmurs  Lab Results Lab Results  Component Value Date   WBC 5.8 01/04/2013   HGB 13.7 01/04/2013   HCT 41.1 01/04/2013   MCV 83.0 01/04/2013   PLT 254 01/04/2013    Lab Results  Component  Value Date   CREATININE 1.40* 01/04/2013   BUN 21 01/04/2013   NA 140 01/04/2013   K 4.5 01/04/2013   CL 107 01/04/2013   CO2 27 01/04/2013    Lab Results  Component Value Date   ALT 23 01/04/2013   AST 22 01/04/2013   ALKPHOS 71 01/04/2013   BILITOT 0.7 01/04/2013    Lab Results  Component Value Date   CHOL 209* 01/04/2013   HDL 52 01/04/2013   LDLCALC 118* 01/04/2013   TRIG 196* 01/04/2013   CHOLHDL 4.0 01/04/2013    Lab Results HIV 1 RNA Quant (copies/mL)  Date Value  01/04/2013 <20   07/08/2012 <20   12/30/2011 64*     CD4 T Cell Abs (cmm)  Date Value  01/04/2013 480   07/08/2012 560   12/30/2011 590      Assessment: Her infection is under excellent control.  Plan: 1. Continue current regimen 2. Followup after blood work in 6 months including estimated GFR   Cliffton Asters, MD [facility] for Infectious Disease [facility] [phone] pager   [phone] cell 01/10/2014, 10:14 AM

## 2014-01-18 ENCOUNTER — Other Ambulatory Visit: Payer: Self-pay | Admitting: Internal Medicine

## 2014-04-11 ENCOUNTER — Telehealth: Payer: Self-pay | Admitting: *Deleted

## 2014-04-11 NOTE — Telephone Encounter (Signed)
Patient signed a ROI 08/25/2013 granting Tammi SouCheryl Saulpaugh access to her medical records for case management.  Elnita MaxwellCheryl is requesting an update.  Forms completed and faxed to [phone].  Originals placed in box for scanning to patient's chart. Andree CossHowell, Michelle M, RN

## 2014-04-13 ENCOUNTER — Other Ambulatory Visit: Payer: Self-pay | Admitting: Internal Medicine

## 2014-04-13 DIAGNOSIS — B20 Human immunodeficiency virus [HIV] disease: Secondary | ICD-10-CM

## 2014-06-08 ENCOUNTER — Telehealth: Payer: Self-pay | Admitting: *Deleted

## 2014-06-08 NOTE — Telephone Encounter (Signed)
Pt states went to the ED Sunday for UTI.  Wanted Dr. Orvan Falconer to be aware of medications given:  Cephalexin  4 times a day x 7 days Tizanidine  q6h PRN 15 hydrocodone for pain  She is requesting a refill of her Boost to be filled via [facility].  Last weight listed here was 3/31 where patient weighed 131.5 lb, had a BMI of 21.4.  Spoke with [facility].  They can fill 1 case/month (24 cans) for patients with BMI <22.   Please advise you approve the rx for Boost. If approved, rx will need to be faxed to [phone].   Thank you,  Andree Coss, RN

## 2014-06-12 ENCOUNTER — Other Ambulatory Visit: Payer: Self-pay | Admitting: *Deleted

## 2014-06-12 DIAGNOSIS — B20 Human immunodeficiency virus [HIV] disease: Secondary | ICD-10-CM

## 2014-06-12 DIAGNOSIS — Z113 Encounter for screening for infections with a predominantly sexual mode of transmission: Secondary | ICD-10-CM

## 2014-06-12 DIAGNOSIS — Z79899 Other long term (current) drug therapy: Secondary | ICD-10-CM

## 2014-06-12 MED ORDER — ENSURE PO LIQD: 1.0000 | Freq: Every day | ORAL | Status: DC

## 2014-06-12 NOTE — Telephone Encounter (Signed)
She can have 1 case /month.

## 2014-06-13 ENCOUNTER — Other Ambulatory Visit: Payer: Self-pay | Admitting: *Deleted

## 2014-06-13 DIAGNOSIS — B20 Human immunodeficiency virus [HIV] disease: Secondary | ICD-10-CM

## 2014-06-13 DIAGNOSIS — Z79899 Other long term (current) drug therapy: Secondary | ICD-10-CM

## 2014-06-13 DIAGNOSIS — Z113 Encounter for screening for infections with a predominantly sexual mode of transmission: Secondary | ICD-10-CM

## 2014-06-13 MED ORDER — ENSURE PO LIQD
1.0000 | Freq: Every day | ORAL | Status: AC
Start: 2014-06-13 — End: 2015-06-13

## 2014-06-16 ENCOUNTER — Other Ambulatory Visit: Payer: Self-pay | Admitting: Internal Medicine

## 2014-06-29 ENCOUNTER — Telehealth: Payer: Self-pay | Admitting: *Deleted

## 2014-06-29 NOTE — Telephone Encounter (Signed)
Issues w/ UTI - seen at ED and Dr. Eligha Bridegroom in [place].  No longer being seen by Dr. Lonna Cobb.  Pt's daughter is concerned about her mother's health and that she is having difficulty taking care of herself at home.  Pt does have both Medicaid and Medicare, A,B,D and a supplement through [facility] Medicare.  RN advised the daughter and pt to follow-up with Dr. Jordan Likes to establish him as her PCP.  RN advised them to investigate Personal Care Service companies in the [place] area to make she that they are reputable and caring.  Then, to take these company names to Dr. Jordan Likes and the pt's DSS social worker to determine if Ms. Morgenthaler meets criteria for the services.  Ms Apperson and her daughter agreed to follow-up and contact recommended agencies and Dr. Jordan Likes.

## 2014-07-04 ENCOUNTER — Encounter (HOSPITAL_COMMUNITY): Payer: Self-pay | Admitting: Emergency Medicine

## 2014-07-04 ENCOUNTER — Telehealth: Payer: Self-pay | Admitting: *Deleted

## 2014-07-04 ENCOUNTER — Emergency Department (HOSPITAL_COMMUNITY)
Admission: EM | Admit: 2014-07-04 | Discharge: 2014-07-04 | Disposition: A | Discharge status: Home / Self Care | Payer: PRIVATE HEALTH INSURANCE | Attending: Emergency Medicine | Admitting: Emergency Medicine

## 2014-07-04 ENCOUNTER — Emergency Department (HOSPITAL_COMMUNITY): Payer: PRIVATE HEALTH INSURANCE

## 2014-07-04 DIAGNOSIS — M5136 Other intervertebral disc degeneration, lumbar region: Secondary | ICD-10-CM

## 2014-07-04 DIAGNOSIS — R112 Nausea with vomiting, unspecified: Secondary | ICD-10-CM | POA: Diagnosis not present

## 2014-07-04 DIAGNOSIS — Z21 Asymptomatic human immunodeficiency virus [HIV] infection status: Secondary | ICD-10-CM | POA: Insufficient documentation

## 2014-07-04 DIAGNOSIS — Z87440 Personal history of urinary (tract) infections: Secondary | ICD-10-CM | POA: Insufficient documentation

## 2014-07-04 DIAGNOSIS — F17200 Nicotine dependence, unspecified, uncomplicated: Secondary | ICD-10-CM | POA: Insufficient documentation

## 2014-07-04 DIAGNOSIS — M51379 Other intervertebral disc degeneration, lumbosacral region without mention of lumbar back pain or lower extremity pain: Secondary | ICD-10-CM | POA: Insufficient documentation

## 2014-07-04 DIAGNOSIS — Z8701 Personal history of pneumonia (recurrent): Secondary | ICD-10-CM | POA: Diagnosis not present

## 2014-07-04 DIAGNOSIS — Z862 Personal history of diseases of the blood and blood-forming organs and certain disorders involving the immune mechanism: Secondary | ICD-10-CM | POA: Insufficient documentation

## 2014-07-04 DIAGNOSIS — M5137 Other intervertebral disc degeneration, lumbosacral region: Secondary | ICD-10-CM | POA: Diagnosis not present

## 2014-07-04 DIAGNOSIS — Z79899 Other long term (current) drug therapy: Secondary | ICD-10-CM | POA: Insufficient documentation

## 2014-07-04 DIAGNOSIS — Z792 Long term (current) use of antibiotics: Secondary | ICD-10-CM | POA: Diagnosis not present

## 2014-07-04 DIAGNOSIS — R109 Unspecified abdominal pain: Secondary | ICD-10-CM | POA: Insufficient documentation

## 2014-07-04 DIAGNOSIS — R1012 Left upper quadrant pain: Secondary | ICD-10-CM | POA: Insufficient documentation

## 2014-07-04 LAB — CBC WITH DIFFERENTIAL/PLATELET
BASOS ABS: 0 10*3/uL (ref 0.0–0.1)
BASOS PCT: 0 % (ref 0–1)
Eosinophils Absolute: 0 10*3/uL (ref 0.0–0.7)
Eosinophils Relative: 0 % (ref 0–5)
HCT: 41.3 % (ref 36.0–46.0)
Hemoglobin: 13.8 g/dL (ref 12.0–15.0)
Lymphocytes Relative: 16 % (ref 12–46)
Lymphs Abs: 1.4 10*3/uL (ref 0.7–4.0)
MCH: 28.6 pg (ref 26.0–34.0)
MCHC: 33.4 g/dL (ref 30.0–36.0)
MCV: 85.5 fL (ref 78.0–100.0)
Monocytes Absolute: 0.9 10*3/uL (ref 0.1–1.0)
Monocytes Relative: 10 % (ref 3–12)
NEUTROS ABS: 6.4 10*3/uL (ref 1.7–7.7)
Neutrophils Relative %: 74 % (ref 43–77)
PLATELETS: 227 10*3/uL (ref 150–400)
RBC: 4.83 MIL/uL (ref 3.87–5.11)
RDW: 12.6 % (ref 11.5–15.5)
WBC: 8.8 10*3/uL (ref 4.0–10.5)

## 2014-07-04 LAB — COMPREHENSIVE METABOLIC PANEL
ALK PHOS: 85 U/L (ref 39–117)
ALT: 25 U/L (ref 0–35)
AST: 29 U/L (ref 0–37)
Albumin: 4.1 g/dL (ref 3.5–5.2)
Anion gap: 15 (ref 5–15)
BUN: 17 mg/dL (ref 6–23)
CHLORIDE: 98 meq/L (ref 96–112)
CO2: 23 mEq/L (ref 19–32)
CREATININE: 1.62 mg/dL — AB (ref 0.50–1.10)
Calcium: 11.1 mg/dL — ABNORMAL HIGH (ref 8.4–10.5)
GFR calc Af Amer: 39 mL/min — ABNORMAL LOW (ref >90)
GFR calc non Af Amer: 33 mL/min — ABNORMAL LOW (ref >90)
Glucose, Bld: 99 mg/dL (ref 70–99)
POTASSIUM: 4.5 meq/L (ref 3.7–5.3)
Sodium: 136 mEq/L — ABNORMAL LOW (ref 137–147)
Total Bilirubin: 3.7 mg/dL — ABNORMAL HIGH (ref 0.3–1.2)
Total Protein: 8 g/dL (ref 6.0–8.3)

## 2014-07-04 LAB — URINALYSIS, ROUTINE W REFLEX MICROSCOPIC
Bilirubin Urine: NEGATIVE
Glucose, UA: NEGATIVE mg/dL
Hgb urine dipstick: NEGATIVE
Ketones, ur: 15 mg/dL — AB
Leukocytes, UA: NEGATIVE
NITRITE: NEGATIVE
PH: 7.5 (ref 5.0–8.0)
Protein, ur: 100 mg/dL — AB
SPECIFIC GRAVITY, URINE: 1.019 (ref 1.005–1.030)
Urobilinogen, UA: 0.2 mg/dL (ref 0.0–1.0)

## 2014-07-04 LAB — URINE MICROSCOPIC-ADD ON

## 2014-07-04 LAB — LIPASE, BLOOD: LIPASE: 24 U/L (ref 11–59)

## 2014-07-04 LAB — I-STAT CG4 LACTIC ACID, ED: LACTIC ACID, VENOUS: 1.27 mmol/L (ref 0.5–2.2)

## 2014-07-04 MED ORDER — PROMETHAZINE HCL 25 MG PO TABS: 25.0000 mg | ORAL_TABLET | Freq: Four times a day (QID) | ORAL | Status: DC | PRN

## 2014-07-04 MED ORDER — IOHEXOL 300 MG/ML  SOLN: 25.0000 mL | INTRAMUSCULAR | Status: AC

## 2014-07-04 MED ORDER — ONDANSETRON HCL 4 MG/2ML IJ SOLN
4.0000 mg | Freq: Once | INTRAMUSCULAR | Status: AC
  Administered 2014-07-04: 4 mg via INTRAVENOUS
  Filled 2014-07-04: qty 2

## 2014-07-04 MED ORDER — SODIUM CHLORIDE 0.9 % IV BOLUS (SEPSIS)
1000.0000 mL | Freq: Once | INTRAVENOUS | Status: AC
  Administered 2014-07-04: 1000 mL via INTRAVENOUS

## 2014-07-04 MED ORDER — HYDROMORPHONE HCL 1 MG/ML IJ SOLN
1.0000 mg | Freq: Once | INTRAMUSCULAR | Status: AC
  Administered 2014-07-04: 1 mg via INTRAVENOUS
  Filled 2014-07-04: qty 1

## 2014-07-04 NOTE — Telephone Encounter (Signed)
Coming to ED now d/t "Acute Kidney Infection."  Wanting to know how to get ID MD involved in her care.  Rn advised that she could ask for an ID MD to consult on her case.  Pt believes that she will be admitted.  Recently seen at hospital in [place] for the same problem.

## 2014-07-04 NOTE — Discharge Instructions (Signed)
Phenergan for nausea. Follow up with primary care doctor for recheck. Return if worsening symtoms.    Degenerative Disk Disease Degenerative disk disease is a condition caused by the changes that occur in the cushions of the backbone (spinal disks) as you grow older. Spinal disks are soft and compressible disks located between the bones of the spine (vertebrae). They act like shock absorbers. Degenerative disk disease can affect the whole spine. However, the neck and lower back are most commonly affected. Many changes can occur in the spinal disks with aging, such as:  The spinal disks may dry and shrink.  Small tears may occur in the tough, outer covering of the disk (annulus).  The disk space may become smaller due to loss of water.  Abnormal growths in the bone (spurs) may occur. This can put pressure on the nerve roots exiting the spinal canal, causing pain.  The spinal canal may become narrowed. CAUSES  Degenerative disk disease is a condition caused by the changes that occur in the spinal disks with aging. The exact cause is not known, but there is a genetic basis for many patients. Degenerative changes can occur due to loss of fluid in the disk. This makes the disk thinner and reduces the space between the backbones. Small cracks can develop in the outer layer of the disk. This can lead to the breakdown of the disk. You are more likely to get degenerative disk disease if you are overweight. Smoking cigarettes and doing heavy work such as weightlifting can also increase your risk of this condition. Degenerative changes can start after a sudden injury. Growth of bone spurs can compress the nerve roots and cause pain.  SYMPTOMS  The symptoms vary from person to person. Some people may have no pain, while others have severe pain. The pain may be so severe that it can limit your activities. The location of the pain depends on the part of your backbone that is affected. You will have neck or arm  pain if a disk in the neck area is affected. You will have pain in your back, buttocks, or legs if a disk in the lower back is affected. The pain becomes worse while bending, reaching up, or with twisting movements. The pain may start gradually and then get worse as time passes. It may also start after a major or minor injury. You may feel numbness or tingling in the arms or legs.  DIAGNOSIS  Your caregiver will ask you about your symptoms and about activities or habits that may cause the pain. He or she may also ask about any injuries, diseases, or treatments you have had earlier. Your caregiver will examine you to check for the range of movement that is possible in the affected area, to check for strength in your extremities, and to check for sensation in the areas of the arms and legs supplied by different nerve roots. An X-ray of the spine may be taken. Your caregiver may suggest other imaging tests, such as magnetic resonance imaging (MRI), if needed.  TREATMENT  Treatment includes rest, modifying your activities, and applying ice and heat. Your caregiver may prescribe medicines to reduce your pain and may ask you to do some exercises to strengthen your back. In some cases, you may need surgery. You and your caregiver will decide on the treatment that is best for you. HOME CARE INSTRUCTIONS   Follow proper lifting and walking techniques as advised by your caregiver.  Maintain good posture.  Exercise regularly  as advised.  Perform relaxation exercises.  Change your sitting, standing, and sleeping habits as advised. Change positions frequently.  Lose weight as advised.  Stop smoking if you smoke.  Wear supportive footwear. SEEK MEDICAL CARE IF:  Your pain does not go away within 1 to 4 weeks. SEEK IMMEDIATE MEDICAL CARE IF:   Your pain is severe.  You notice weakness in your arms, hands, or legs.  You begin to lose control of your bladder or bowel movements. MAKE SURE YOU:    Understand these instructions.  Will watch your condition.  Will get help right away if you are not doing well or get worse. Document Released: 07/27/2007 Document Revised: 12/22/2011 Document Reviewed: 01/31/2014 [facility] Patient Information 2015 [place], [place]. This information is not intended to replace advice given to you by your health care provider. Make sure you discuss any questions you have with your health care provider.

## 2014-07-04 NOTE — ED Notes (Addendum)
Case management at bedside.

## 2014-07-04 NOTE — ED Provider Notes (Signed)
CSN: [id number]     Arrival date & time 07/04/14  1015 History   First MD Initiated Contact with Patient 07/04/14 1037     Chief Complaint  Patient presents with  . Flank Pain    lt. abdominal pain     (Consider location/radiation/quality/duration/timing/severity/associated sxs/prior Treatment) HPI Kara Munoz is a 58 y.o. female history of HIV disease, anemia, recurrent pneumonia, presents emergency department complaining of abdominal pain, back pain. Patient states she has had issues with urinary tract infection for last week and a half. She was seen a week ago by her primary care Dr., placed on antibiotics for her UTI. States days ago she developed left flank pain, was admitted to [facility]. There she states that they told her her pain is most likely coming from her back. She states that an MRI. The found her to have trouble doing her ADLs, and she was admitted to rehabilitation center. States she did not like it there, she states she continued to have pain, she is continuously nausea vomiting, and states nobody would do anything for her. She left there today came here for reevaluation. Currently patient's main complaint is nausea, vomiting, left abdominal pain radiating into the left flank. She denies any fever or chills. Denies any diarrhea. She states she has not had a bowel movement in 1 week. She denies any urinary symptoms. No numbness or weakness in extremities.  History reviewed. No pertinent past medical history. Past Surgical History  Procedure Laterality Date  . Abdominal hysterectomy     No family history on file. History  Substance Use Topics  . Smoking status: Current Some Day Smoker -- 0.10 packs/day for 35 years    Types: Cigarettes  . Smokeless tobacco: Never Used     Comment: using "E" cigarettes  . Alcohol Use: 0.0 oz/week   OB History   Grav Para Term Preterm Abortions TAB SAB Ect Mult Living                 Review of Systems  Constitutional:  Negative for fever and chills.  Respiratory: Negative for cough, chest tightness and shortness of breath.   Cardiovascular: Negative for chest pain, palpitations and leg swelling.  Gastrointestinal: Positive for nausea, vomiting and abdominal pain. Negative for diarrhea.  Genitourinary: Negative for dysuria, flank pain and pelvic pain.  Musculoskeletal: Positive for back pain. Negative for arthralgias, myalgias, neck pain and neck stiffness.  Skin: Negative for rash.  Neurological: Negative for dizziness, weakness and headaches.  All other systems reviewed and are negative.     Allergies  Review of patient's allergies indicates no known allergies.  Home Medications   Prior to Admission medications   Medication Sig Start Date End Date Taking? Authorizing Provider  atazanavir (REYATAZ) 300 MG capsule Take 300 mg by mouth daily with breakfast.   Yes Historical Provider, MD  bimatoprost (LUMIGAN) 0.01 % SOLN Place 1 drop into both eyes at bedtime.   Yes Historical Provider, MD  ciprofloxacin (CIPRO) 500 MG tablet Take 500 mg by mouth 2 (two) times daily.   Yes Historical Provider, MD  emtricitabine-tenofovir (TRUVADA) 200-300 MG per tablet Take 1 tablet by mouth daily.   Yes Historical Provider, MD  ENSURE (ENSURE) Take 1 Can by mouth daily. 06/13/14 06/13/15 Yes Cliffton Asters, MD  fish oil-omega-3 fatty acids 1000 MG capsule Take 1 g by mouth daily.   Yes Historical Provider, MD  HYDROcodone-acetaminophen (NORCO/VICODIN) 5-325 MG per tablet Take 1 tablet by mouth every 6 (six)  hours as needed (for pain).  06/28/14  Yes Historical Provider, MD  olopatadine (PATANOL) 0.1 % ophthalmic solution Place 1 drop into both eyes every morning.   Yes Historical Provider, MD  pravastatin (PRAVACHOL) 20 MG tablet Take 1 tablet (20 mg total) by mouth daily. 06/16/14  Yes Cliffton Asters, MD  ritonavir (NORVIR) 100 MG capsule Take 100 mg by mouth daily with breakfast.   Yes Historical Provider, MD   There were  no vitals taken for this visit. Physical Exam  Nursing note and vitals reviewed. Constitutional: She appears well-developed and well-nourished. No distress.  Eyes: Conjunctivae are normal.  Neck: Neck supple.  Cardiovascular: Normal rate, regular rhythm and normal heart sounds.   Pulmonary/Chest: Effort normal and breath sounds normal. No respiratory distress. She has no wheezes. She has no rales.  Abdominal: Soft. Bowel sounds are normal. She exhibits no distension. There is tenderness. There is no rebound and no guarding.  LUQ, LLQ, left flank tenderness  Neurological: She is alert.  Skin: Skin is warm and dry.    ED Course  Procedures (including critical care time) Labs Review Labs Reviewed  COMPREHENSIVE METABOLIC PANEL - Abnormal; Notable for the following:    Sodium 136 (*)    Creatinine, Ser 1.62 (*)    Calcium 11.1 (*)    Total Bilirubin 3.7 (*)    GFR calc non Af Amer 33 (*)    GFR calc Af Amer 39 (*)    All other components within normal limits  URINALYSIS, ROUTINE W REFLEX MICROSCOPIC - Abnormal; Notable for the following:    Ketones, ur 15 (*)    Protein, ur 100 (*)    All other components within normal limits  URINE MICROSCOPIC-ADD ON - Abnormal; Notable for the following:    Casts HYALINE CASTS (*)    All other components within normal limits  CBC WITH DIFFERENTIAL  LIPASE, BLOOD  I-STAT CG4 LACTIC ACID, ED    Imaging Review Dg Abd Acute W/chest  07/04/2014   CLINICAL DATA:  Abdominal pain.  EXAM: ACUTE ABDOMEN SERIES (ABDOMEN 2 VIEW & CHEST 1 VIEW)  COMPARISON:  None.  FINDINGS: There is no evidence of dilated bowel loops or free intraperitoneal air. Mild amount of stool is noted throughout the colon. No radiopaque calculi or other significant radiographic abnormality is seen. Heart size and mediastinal contours are within normal limits. Both lungs are clear.  IMPRESSION: No evidence of bowel obstruction or ileus. No acute cardiopulmonary abnormality seen.    Electronically Signed   By: Roque Lias M.D.   On: 07/04/2014 12:18     EKG Interpretation None      MDM   Final diagnoses:  Flank pain  Degenerative disc disease, lumbar    pt seen and examined. Will obtain records from [place]. Urinalysis, labs, acute abdominal series, fluids, pain medications ordered.  3:32 PM Patient's lab work is unremarkable. Creatinine is elevated, but slightly higher than baseline. Acute abdominal series is negative. Urinalysis negative. Records obtained from [place]. Patient had a CT without contrast of abdomen and pelvis done on 06/30/2014, showed endoluminal sounds bilaterally, no hydroureteronephrosis. Moderate amount of stool in large bowel. Small hiatal hernia, multilevel degenerative disc disease. Patient had MRI following this study which showed a 4-5 degenerative disc disease with advanced bilateral facet arthropathy with moderate spinal canal and moderate bilateral neural foraminal stenosis. There is 45 facet joint effusion, eating states they cannot rule out an infection. Based on patient's presentation, fact that she is afebrile,  and elevated white count, she is nontoxic appearing, doubt this is infectious. Patient is feeling much better here after medications. Her abdomen is benign. Discussed with Dr. Ethelda Chick, who has seen patient, agrees with evaluation. No admission indicated this time. I discussed with case management who has seen patient as well. There were all set up at home PTOT. Patient does not qualify for home nursing. Patient will be discharged home, she is to followup with primary care Dr. Stann Mainland discharge with antiemetics.    Filed Vitals:   07/04/14 1330 07/04/14 1345 07/04/14 1400 07/04/14 1529  BP: 124/66 135/64 126/63 123/73  Pulse: 78 69 76 65  Temp:      TempSrc:      Resp: Height:      Weight:      SpO2: 91% 97% 95% 99%     Lottie Mussel, PA-C 07/04/14 1634

## 2014-07-04 NOTE — ED Provider Notes (Signed)
Point of low back pain which moved "from side to side" for the past several days. She denies any abdominal pain denies urinary symptoms. She has vomited 4 or 5 times over the past 2 days. Patient recently treated for urinary tract infection. On exam patient is alert Glasgow Coma Score 15 abdomen soft nontender nondistended normal active bowel sounds back without point tenderness. Gait is mildly unsteady. Patient had been staying at a nursing home for rehabilitation after discharge from [facility] where she spent in patient stay for back pain. She does not wish to return to nursing home. She wishes to go home. She lives with her boyfriend. We will obtain case management consult to assess home health needs. Patient likely needs a walker  Doug Sou, MD 07/04/14 1344

## 2014-07-04 NOTE — ED Notes (Addendum)
Assisted Terrence, RN with in and out cath

## 2014-07-04 NOTE — ED Notes (Signed)
Pt has returned from being out of the department; pt placed back on continuous pulse oximetry and blood pressure cuff; friend at bedside

## 2014-07-04 NOTE — ED Provider Notes (Signed)
Medical screening examination/treatment/procedure(s) were conducted as a shared visit with non-physician practitioner(s) and myself.  I personally evaluated the patient during the encounter.   EKG Interpretation None       Doug Sou, MD 07/04/14 1642

## 2014-07-04 NOTE — Progress Notes (Signed)
  CARE MANAGEMENT ED NOTE 07/04/2014  Patient:  Kara Munoz, Kara Munoz   Account Number:  [id number]  Date Initiated:  07/04/2014  Documentation initiated by:  Ferdinand Cava  Subjective/Objective Assessment:   25 female presenting to ED with c/o back pain and early discharge from rehab facility     Subjective/Objective Assessment Detail:     Action/Plan:   Patient will be discharged home with [facility] services with Kara Munoz for PT and OT   Action/Plan Detail:   Anticipated DC Date:       Status Recommendation to Physician:   Result of Recommendation:  Agreed    DC Planning Services  CM consult  Other    Choice offered to / List presented to:  C-1 Patient     HH arranged  HH-2 PT  HH-3 OT      [facility] agency  [facility]    Status of service:  Completed, signed off  ED Comments:   ED Comments Detail:  CM consulted to assist with [facility] services. CM spoke with the patient and her boyfriend Kara Munoz regarding patient situation and HH needs. Patient stated that she was recently admitted to [facility] treated for "kidney infection and arthritis of my back". She stated upon discharge they discussed either HH PT and OT or a rehab facility and the patient stated that her daughter talked her into a rehab facility. The patient stated that she was at [facility] but she did not like it and does not want to go back. She stated that she was there for one day and left today to come to this ED. This CM spoke with the patient about [facility] services for PT and OT and the patient was agreeable to services. The patient stated that her PCP is Dr. Jordan Likes and this CM encouraged the patient to make a hospital follow up appointment. This CM offered the patient choice and she chose [place]. This CM left a VM message for [facility], Venia Minks, for PT, OT referral. The patient and her boyfriend verbalized understanding and had no other questions or concerns.

## 2014-07-04 NOTE — ED Notes (Signed)
Lt. Flank pain radiates into lt.  Lower abdominal.  Pt. Is nauseated and vomiting. Denies any urinary problems.  Pt. Was discharged from the [facility] and she received a enema and moved her bowels today.  Pt. Is alert and oriented X4.  Skin is warm and dry

## 2014-07-11 ENCOUNTER — Ambulatory Visit: Payer: Medicare Other | Admitting: Internal Medicine

## 2014-07-18 ENCOUNTER — Ambulatory Visit: Payer: Medicare Other | Admitting: Internal Medicine

## 2014-07-21 ENCOUNTER — Other Ambulatory Visit: Payer: Self-pay | Admitting: Internal Medicine

## 2014-07-31 ENCOUNTER — Other Ambulatory Visit: Payer: Self-pay | Admitting: Internal Medicine

## 2014-07-31 DIAGNOSIS — A6000 Herpesviral infection of urogenital system, unspecified: Secondary | ICD-10-CM

## 2014-08-01 ENCOUNTER — Encounter: Payer: Self-pay | Admitting: Internal Medicine

## 2014-08-01 ENCOUNTER — Ambulatory Visit (INDEPENDENT_AMBULATORY_CARE_PROVIDER_SITE_OTHER): Payer: PRIVATE HEALTH INSURANCE | Admitting: Internal Medicine

## 2014-08-01 VITALS — BP 132/86 | HR 72 | Temp 97.8°F | Ht 66.0 in | Wt 137.0 lb

## 2014-08-01 DIAGNOSIS — B20 Human immunodeficiency virus [HIV] disease: Secondary | ICD-10-CM

## 2014-08-01 DIAGNOSIS — Z113 Encounter for screening for infections with a predominantly sexual mode of transmission: Secondary | ICD-10-CM

## 2014-08-01 DIAGNOSIS — Z79899 Other long term (current) drug therapy: Secondary | ICD-10-CM

## 2014-08-01 DIAGNOSIS — Z23 Encounter for immunization: Secondary | ICD-10-CM

## 2014-08-01 NOTE — Progress Notes (Signed)
Patient ID: Kara Munoz, female   DOB: [birth year], 58 y.o.   MRN: [id number]          Patient Active Problem List   Diagnosis Date Noted  . Human immunodeficiency virus (HIV) disease 07/23/2006    Priority: High  . DYSLIPIDEMIA 02/19/2010    Priority: Medium  . HYPERTENSION NEC 12/25/2009    Priority: Medium  . CIGARETTE SMOKER 05/09/2008    Priority: Medium  . ANXIETY 07/23/2006    Priority: Medium  . SUBSTANCE ABUSE, MULTIPLE 07/23/2006    Priority: Medium  . DEPRESSION 07/23/2006    Priority: Medium  . DYSPLASIA, CERVIX NOS 07/23/2006    Priority: Medium  . GLAUCOMA 05/09/2008  . APPENDECTOMY, HX OF 12/02/2006  . GENITAL HERPES 07/23/2006  . 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 WARTS, OTHER SPECIFIED VIRAL 07/23/2006  . SYPHILIS 07/23/2006  . ANEMIA-NOS 07/23/2006  . PNEUMONIA, RECURRENT 07/23/2006  . DENTAL CARIES 07/23/2006    Patient's Medications  New Prescriptions   No medications on file  Previous Medications   ACYCLOVIR CREAM (ZOVIRAX) 5 %    Apply topically.   ATAZANAVIR (REYATAZ) 300 MG CAPSULE    Take 300 mg by mouth daily with breakfast.   BIMATOPROST (LUMIGAN) 0.01 % SOLN    Place 1 drop into both eyes at bedtime.   CALCIUM CARBONATE-VITAMIN D (OYSTER SHELL CALCIUM/D) 250-125 MG-UNIT TABS    Take 1 tablet by mouth.   ECONAZOLE NITRATE 1 % CREAM    Apply topically.   ENSURE (ENSURE)    Take 1 Can by mouth daily.   FISH OIL-OMEGA-3 FATTY ACIDS 1000 MG CAPSULE    Take 1 g by mouth daily.   HYDROCODONE-ACETAMINOPHEN (NORCO/VICODIN) 5-325 MG PER TABLET    Take 1 tablet by mouth every 6 (six) hours as needed (for pain).    MULTIPLE VITAMIN (THERA) TABS    Take 1 tablet by mouth.   NAPROXEN (NAPROSYN) 375 MG TABLET    TAKE ONE TABLET BY MOUTH TWICE DAILY with meals   OLOPATADINE (PATANOL) 0.1 % OPHTHALMIC SOLUTION    Place 1 drop into both eyes every morning.   PRAVASTATIN (PRAVACHOL) 20 MG TABLET    Take 1 tablet (20 mg total) by mouth daily.   PROMETHAZINE (PHENERGAN) 25 MG TABLET    Take 1 tablet (25 mg total) by mouth every 6 (six) hours as needed for nausea or vomiting.   RITONAVIR (NORVIR) 100 MG CAPSULE    Take 100 mg by mouth daily with breakfast.   SILVER SULFADIAZINE (SILVADENE) 1 % CREAM    Apply topically 2 (two) times daily.   TIZANIDINE (ZANAFLEX) 4 MG TABLET       TRUVADA 200-300 MG PER TABLET    TAKE 1 TABLET BY MOUTH EVERY DAY   VALACYCLOVIR (VALTREX) 1000 MG TABLET    Take 1/2 tablet (500 mg total) by mouth 2 (two) times daily. for 5 days as needed.  Modified Medications   No medications on file  Discontinued Medications   CIPROFLOXACIN (CIPRO) 500 MG TABLET    Take 500 mg by mouth 2 (two) times daily.   EMTRICITABINE-TENOFOVIR (TRUVADA) 200-300 MG PER TABLET    Take 1 tablet by mouth daily.    Subjective: Kara Munoz is in for her routine visit. She was recently hospitalized in [place] with a urinary tract infection. She is feeling better now. She states that throughout this she has not missed any doses of her HIV medications. Review of Systems: Pertinent items are noted in  HPI.  No past medical history on file.  History  Substance Use Topics  . Smoking status: Former Smoker -- 0.10 packs/day for 35 years    Types: Cigarettes  . Smokeless tobacco: Never Used     Comment: quit!!  . Alcohol Use: 0.0 oz/week    No family history on file.  No Known Allergies  Objective: Temp: 97.8 F (36.6 C) (10/20 1016) Temp Source: Oral (10/20 1016) BP: 132/86 mmHg (10/20 1016) Pulse Rate: 72 (10/20 1016) Body mass index is 22.12 kg/(m^2).  General: She is in good spirits as usual Oral: No oropharyngeal lesions Skin: No rash Lungs: Clear Cor: Regular S1 and S2 with no murmurs   Lab Results Lab Results  Component Value Date   WBC 8.8 07/04/2014   HGB 13.8 07/04/2014   HCT 41.3 07/04/2014   MCV 85.5 07/04/2014   PLT 227 07/04/2014    Lab Results  Component Value Date   CREATININE 1.62* 07/04/2014   BUN 17  07/04/2014   NA 136* 07/04/2014   K 4.5 07/04/2014   CL 98 07/04/2014   CO2 23 07/04/2014    Lab Results  Component Value Date   ALT 25 07/04/2014   AST 29 07/04/2014   ALKPHOS 85 07/04/2014   BILITOT 3.7* 07/04/2014    Lab Results  Component Value Date   CHOL 209* 01/04/2013   HDL 52 01/04/2013   LDLCALC 118* 01/04/2013   TRIG 196* 01/04/2013   CHOLHDL 4.0 01/04/2013    Lab Results HIV 1 RNA Quant (copies/mL)  Date Value  01/04/2013 <20   07/08/2012 <20   12/30/2011 64*     CD4 T Cell Abs (cmm)  Date Value  01/04/2013 480   07/08/2012 560   12/30/2011 590      Assessment: It is been 18 months since she had blood work but it sounds like her adherence to her HIV medicines continues to be very good. She has had a slight bump in her creatinine. I will check blood work today and see her back in 3-4 weeks to consider a new salvage regimen that allows [place] to get her off of tenofovir.  Plan: 1. Continue current antiretroviral regimen for now 2. Check CD4, viral load, basic metabolic panel, and HLA B 5701 3. Shingles vaccine today 4. Followup in 3-4 weeks   Michel Bickers, MD [facility] for [place] [phone] pager   [phone] cell 08/01/2014, 10:47 AM

## 2014-08-01 NOTE — Addendum Note (Signed)
Addended by: Andree CossHOWELL, MICHELLE M on: 08/01/2014 12:09 PM   Modules accepted: Orders

## 2014-08-02 LAB — LIPID PANEL
Cholesterol: 166 mg/dL (ref 0–200)
HDL: 51 mg/dL (ref >39)
LDL Cholesterol: 89 mg/dL (ref 0–99)
Total CHOL/HDL Ratio: 3.3 Ratio
Triglycerides: 132 mg/dL (ref <150)
VLDL: 26 mg/dL (ref 0–40)

## 2014-08-02 LAB — BASIC METABOLIC PANEL
BUN: 9 mg/dL (ref 6–23)
CHLORIDE: 106 meq/L (ref 96–112)
CO2: 28 mEq/L (ref 19–32)
CREATININE: 1.5 mg/dL — AB (ref 0.50–1.10)
Calcium: 10.8 mg/dL — ABNORMAL HIGH (ref 8.4–10.5)
GLUCOSE: 67 mg/dL — AB (ref 70–99)
POTASSIUM: 4.7 meq/L (ref 3.5–5.3)
Sodium: 142 mEq/L (ref 135–145)

## 2014-08-02 LAB — HIV-1 RNA QUANT-NO REFLEX-BLD
HIV 1 RNA Quant: <20 copies/mL (ref <20)
HIV-1 RNA Quant, Log: <1.3 {Log} (ref <1.30)

## 2014-08-02 LAB — T-HELPER CELL (CD4) - (RCID CLINIC ONLY)
CD4 T CELL HELPER: 28 % — AB (ref 33–55)
CD4 T Cell Abs: 720 /uL (ref 400–2700)

## 2014-08-02 LAB — RPR

## 2014-08-05 LAB — HLA B*5701: HLA-B 5701 W/RFLX HLA-B HIGH: NEGATIVE

## 2014-08-29 ENCOUNTER — Encounter: Payer: Self-pay | Admitting: Internal Medicine

## 2014-08-29 ENCOUNTER — Ambulatory Visit (INDEPENDENT_AMBULATORY_CARE_PROVIDER_SITE_OTHER): Payer: PRIVATE HEALTH INSURANCE | Admitting: Internal Medicine

## 2014-08-29 ENCOUNTER — Telehealth: Payer: Self-pay | Admitting: *Deleted

## 2014-08-29 DIAGNOSIS — B20 Human immunodeficiency virus [HIV] disease: Secondary | ICD-10-CM

## 2014-08-29 MED ORDER — ABACAVIR-DOLUTEGRAVIR-LAMIVUD 600-50-300 MG PO TABS: 1.0000 | ORAL_TABLET | Freq: Every day | ORAL | Status: DC

## 2014-08-29 MED ORDER — ATAZANAVIR-COBICISTAT 300-150 MG PO TABS: 1.0000 | ORAL_TABLET | Freq: Every day | ORAL | Status: DC

## 2014-08-29 NOTE — Progress Notes (Signed)
Patient ID: Kara Munoz, female   DOB: 06-28-1956, 58 y.o.   MRN: [id number]          Patient Active Problem List   Diagnosis Date Noted  . Human immunodeficiency virus (HIV) disease 07/23/2006    Priority: High  . DYSLIPIDEMIA 02/19/2010    Priority: Medium  . HYPERTENSION NEC 12/25/2009    Priority: Medium  . CIGARETTE SMOKER 05/09/2008    Priority: Medium  . ANXIETY 07/23/2006    Priority: Medium  . SUBSTANCE ABUSE, MULTIPLE 07/23/2006    Priority: Medium  . DEPRESSION 07/23/2006    Priority: Medium  . DYSPLASIA, CERVIX NOS 07/23/2006    Priority: Medium  . GLAUCOMA 05/09/2008  . APPENDECTOMY, HX OF 12/02/2006  . GENITAL HERPES 07/23/2006  . 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 WARTS, OTHER SPECIFIED VIRAL 07/23/2006  . SYPHILIS 07/23/2006  . ANEMIA-NOS 07/23/2006  . PNEUMONIA, RECURRENT 07/23/2006  . DENTAL CARIES 07/23/2006    Patient's Medications  New Prescriptions   ABACAVIR-DOLUTEGRAVIR-LAMIVUD 600-50-300 MG TABS    Take 1 tablet by mouth daily.   ATAZANAVIR-COBICISTAT (EVOTAZ) 300-150 MG PER TABLET    Take 1 tablet by mouth daily. Swallow whole. Do NOT crush, cut or chew tablet. Take with food.  Previous Medications   ACYCLOVIR CREAM (ZOVIRAX) 5 %    Apply topically.   BIMATOPROST (LUMIGAN) 0.01 % SOLN    Place 1 drop into both eyes at bedtime.   CALCIUM CARBONATE-VITAMIN D (OYSTER SHELL CALCIUM/D) 250-125 MG-UNIT TABS    Take 1 tablet by mouth.   ECONAZOLE NITRATE 1 % CREAM    Apply topically.   ENSURE (ENSURE)    Take 1 Can by mouth daily.   FISH OIL-OMEGA-3 FATTY ACIDS 1000 MG CAPSULE    Take 1 g by mouth daily.   HYDROCODONE-ACETAMINOPHEN (NORCO/VICODIN) 5-325 MG PER TABLET    Take 1 tablet by mouth every 6 (six) hours as needed (for pain).    MULTIPLE VITAMIN (THERA) TABS    Take 1 tablet by mouth.   NAPROXEN (NAPROSYN) 375 MG TABLET    TAKE ONE TABLET BY MOUTH TWICE DAILY with meals   OLOPATADINE (PATANOL) 0.1 % OPHTHALMIC SOLUTION    Place 1 drop  into both eyes every morning.   PRAVASTATIN (PRAVACHOL) 20 MG TABLET    Take 1 tablet (20 mg total) by mouth daily.   PROMETHAZINE (PHENERGAN) 25 MG TABLET    Take 1 tablet (25 mg total) by mouth every 6 (six) hours as needed for nausea or vomiting.   SILVER SULFADIAZINE (SILVADENE) 1 % CREAM    Apply topically 2 (two) times daily.   TIZANIDINE (ZANAFLEX) 4 MG TABLET       VALACYCLOVIR (VALTREX) 1000 MG TABLET    Take 1/2 tablet (500 mg total) by mouth 2 (two) times daily. for 5 days as needed.  Modified Medications   No medications on file  Discontinued Medications   ATAZANAVIR (REYATAZ) 300 MG CAPSULE    Take 300 mg by mouth daily with breakfast.   RITONAVIR (NORVIR) 100 MG CAPSULE    Take 100 mg by mouth daily with breakfast.   TRUVADA 200-300 MG PER TABLET    TAKE 1 TABLET BY MOUTH EVERY DAY    Subjective: Kara Munoz is in for her follow-up visit. She had one episode of hematochezia after straining to have a bowel movement recently. She is due to see her primary care physician, Dr. Katy Fitch in 3 days. She has not missed any doses of  her HIV medications. Review of Systems: Pertinent items are noted in HPI.  No past medical history on file.  History  Substance Use Topics  . Smoking status: Former Smoker -- 0.10 packs/day for 35 years    Types: Cigarettes  . Smokeless tobacco: Never Used     Comment: quit!!  . Alcohol Use: 0.0 oz/week    No family history on file.  No Known Allergies  Objective: Temp: 98.2 F (36.8 C) (11/17 0942) Temp Source: Oral (11/17 0942) BP: 114/63 mmHg (11/17 0942) Pulse Rate: 80 (11/17 0942) Body mass index is 22.45 kg/(m^2).  General: she is in good spirits. Her weight is up to 139 pounds Oral: no oropharyngeal lesions Skin: no rash Lungs: clear Cor: regular S1 and S2 with no murmur  Lab Results Lab Results  Component Value Date   WBC 8.8 07/04/2014   HGB 13.8 07/04/2014   HCT 41.3 07/04/2014   MCV 85.5 07/04/2014   PLT 227 07/04/2014     Lab Results  Component Value Date   CREATININE 1.50* 08/01/2014   BUN 9 08/01/2014   NA 142 08/01/2014   K 4.7 08/01/2014   CL 106 08/01/2014   CO2 28 08/01/2014    Lab Results  Component Value Date   ALT 25 07/04/2014   AST 29 07/04/2014   ALKPHOS 85 07/04/2014   BILITOT 3.7* 07/04/2014    Lab Results  Component Value Date   CHOL 166 08/01/2014   HDL 51 08/01/2014   LDLCALC 89 08/01/2014   TRIG 132 08/01/2014   CHOLHDL 3.3 08/01/2014    Lab Results HIV 1 RNA QUANT (copies/mL)  Date Value  08/01/2014 <20  01/04/2013 <20  07/08/2012 <20   CD4 T CELL ABS  Date Value  08/01/2014 720 /uL  01/04/2013 480 cmm  07/08/2012 560 cmm   HLA B5701: Negative   Assessment: Given her mild renal insufficiency I will change her antiretroviral regimen to Triumeq and Evotaz.  Plan: 1. Change antiretroviral regimen to Triumeq and even Evotaz 2. Follow-up after lab work in [place] months   Michel Bickers, MD [facility] for [place] [phone] pager   [phone] cell 08/29/2014, 9:57 AM

## 2014-08-29 NOTE — Telephone Encounter (Signed)
Pt can take "old" regimen tonight, start "new" one 08/30/14.  Pharmacy stated that they have to order the "new" rxes and they will be in tomorrow.

## 2015-02-26 ENCOUNTER — Encounter: Payer: Self-pay | Admitting: Internal Medicine

## 2015-02-27 ENCOUNTER — Ambulatory Visit: Payer: PRIVATE HEALTH INSURANCE | Admitting: Internal Medicine

## 2015-03-20 ENCOUNTER — Encounter: Payer: Self-pay | Admitting: Internal Medicine

## 2015-03-20 ENCOUNTER — Ambulatory Visit (INDEPENDENT_AMBULATORY_CARE_PROVIDER_SITE_OTHER): Payer: Medicare Other | Admitting: Internal Medicine

## 2015-03-20 VITALS — BP 136/82 | HR 77 | Temp 98.3°F | Wt 139.5 lb

## 2015-03-20 DIAGNOSIS — B20 Human immunodeficiency virus [HIV] disease: Secondary | ICD-10-CM

## 2015-03-20 DIAGNOSIS — Z79899 Other long term (current) drug therapy: Secondary | ICD-10-CM

## 2015-03-20 DIAGNOSIS — Z113 Encounter for screening for infections with a predominantly sexual mode of transmission: Secondary | ICD-10-CM | POA: Diagnosis not present

## 2015-03-20 NOTE — Progress Notes (Signed)
Patient ID: Kara Munoz, female   DOB: 04/28/1956, 59 y.o.   MRN: [id number]          Patient Active Problem List   Diagnosis Date Noted  . Human immunodeficiency virus (HIV) disease 07/23/2006    Priority: High  . DYSLIPIDEMIA 02/19/2010    Priority: Medium  . HYPERTENSION NEC 12/25/2009    Priority: Medium  . CIGARETTE SMOKER 05/09/2008    Priority: Medium  . ANXIETY 07/23/2006    Priority: Medium  . SUBSTANCE ABUSE, MULTIPLE 07/23/2006    Priority: Medium  . DEPRESSION 07/23/2006    Priority: Medium  . DYSPLASIA, CERVIX NOS 07/23/2006    Priority: Medium  . GLAUCOMA 05/09/2008  . APPENDECTOMY, HX OF 12/02/2006  . GENITAL HERPES 07/23/2006  . 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 WARTS, OTHER SPECIFIED VIRAL 07/23/2006  . SYPHILIS 07/23/2006  . ANEMIA-NOS 07/23/2006  . PNEUMONIA, RECURRENT 07/23/2006  . DENTAL CARIES 07/23/2006    Patient's Medications  New Prescriptions   No medications on file  Previous Medications   ABACAVIR-DOLUTEGRAVIR-LAMIVUD 600-50-300 MG TABS    Take 1 tablet by mouth daily.   ACYCLOVIR CREAM (ZOVIRAX) 5 %    Apply topically.   ATAZANAVIR-COBICISTAT (EVOTAZ) 300-150 MG PER TABLET    Take 1 tablet by mouth daily. Swallow whole. Do NOT crush, cut or chew tablet. Take with food.   BIMATOPROST (LUMIGAN) 0.01 % SOLN    Place 1 drop into both eyes at bedtime.   CALCIUM CARBONATE-VITAMIN D (OYSTER SHELL CALCIUM/D) 250-125 MG-UNIT TABS    Take 1 tablet by mouth.   ECONAZOLE NITRATE 1 % CREAM    Apply topically.   ENSURE (ENSURE)    Take 1 Can by mouth daily.   FISH OIL-OMEGA-3 FATTY ACIDS 1000 MG CAPSULE    Take 1 g by mouth daily.   HYDROCODONE-ACETAMINOPHEN (NORCO/VICODIN) 5-325 MG PER TABLET    Take 1 tablet by mouth every 6 (six) hours as needed (for pain).    MULTIPLE VITAMIN (THERA) TABS    Take 1 tablet by mouth.   NAPROXEN (NAPROSYN) 375 MG TABLET    TAKE ONE TABLET BY MOUTH TWICE DAILY with meals   OLOPATADINE (PATANOL) 0.1 % OPHTHALMIC  SOLUTION    Place 1 drop into both eyes every morning.   PRAVASTATIN (PRAVACHOL) 20 MG TABLET    Take 1 tablet (20 mg total) by mouth daily.   PROMETHAZINE (PHENERGAN) 25 MG TABLET    Take 1 tablet (25 mg total) by mouth every 6 (six) hours as needed for nausea or vomiting.   SILVER SULFADIAZINE (SILVADENE) 1 % CREAM    Apply topically 2 (two) times daily.   TIZANIDINE (ZANAFLEX) 4 MG TABLET       VALACYCLOVIR (VALTREX) 1000 MG TABLET    Take 1/2 tablet (500 mg total) by mouth 2 (two) times daily. for 5 days as needed.  Modified Medications   No medications on file  Discontinued Medications   No medications on file    Subjective: Kara Munoz is in for her routine follow-up visit. She is very happy today as it is her 59st birthday. She states that she never misses a single dose of her Triumeq or Evotaz. She takes them each evening at midnight. Review of Systems: Pertinent items are noted in HPI.  Past Medical History  Diagnosis Date  . HIV infection   . Depression   . Hyperlipidemia     History  Substance Use Topics  . Smoking status: Former Smoker --  0.10 packs/day for 35 years    Types: Cigarettes  . Smokeless tobacco: Never Used     Comment: quit!!  . Alcohol Use: No    No family history on file.  No Known Allergies  Objective: Temp: 98.3 F (36.8 C) (06/07 1024) Temp Source: Oral (06/07 1024) BP: 136/82 mmHg (06/07 1024) Pulse Rate: 77 (06/07 1024) Body mass index is 22.53 kg/(m^2).  General: Her weight is up 7 pounds over the past year to 139  Lab Results Lab Results  Component Value Date   WBC 8.8 07/04/2014   HGB 13.8 07/04/2014   HCT 41.3 07/04/2014   MCV 85.5 07/04/2014   PLT 227 07/04/2014    Lab Results  Component Value Date   CREATININE 1.50* 08/01/2014   BUN 9 08/01/2014   NA 142 08/01/2014   K 4.7 08/01/2014   CL 106 08/01/2014   CO2 28 08/01/2014    Lab Results  Component Value Date   ALT 25 07/04/2014   AST 29 07/04/2014   ALKPHOS 85  07/04/2014   BILITOT 3.7* 07/04/2014    Lab Results  Component Value Date   CHOL 166 08/01/2014   HDL 51 08/01/2014   LDLCALC 89 08/01/2014   TRIG 132 08/01/2014   CHOLHDL 3.3 08/01/2014    Lab Results HIV 1 RNA QUANT (copies/mL)  Date Value  08/01/2014 <20  01/04/2013 <20  07/08/2012 <20   CD4 T CELL ABS  Date Value  08/01/2014 720 /uL  01/04/2013 480 cmm  07/08/2012 560 cmm     Assessment: Her adherence is excellent and her infection has been under very good control.  Plan: 1. Continue current anti-retroviral regimen 2. Repeat lab work today 3. Follow-up in 6 months   Cliffton Asters, MD [facility] [phone] pager   [phone] cell 03/20/2015, 10:41 AM

## 2015-03-21 LAB — COMPREHENSIVE METABOLIC PANEL
ALK PHOS: 70 U/L (ref 39–117)
ALT: 17 U/L (ref 0–35)
AST: 21 U/L (ref 0–37)
Albumin: 4.3 g/dL (ref 3.5–5.2)
BILIRUBIN TOTAL: 3 mg/dL — AB (ref 0.2–1.2)
BUN: 16 mg/dL (ref 6–23)
CO2: 29 meq/L (ref 19–32)
CREATININE: 1.18 mg/dL — AB (ref 0.50–1.10)
Calcium: 10.5 mg/dL (ref 8.4–10.5)
Chloride: 106 mEq/L (ref 96–112)
GLUCOSE: 72 mg/dL (ref 70–99)
Potassium: 4.2 mEq/L (ref 3.5–5.3)
SODIUM: 143 meq/L (ref 135–145)
Total Protein: 6.8 g/dL (ref 6.0–8.3)

## 2015-03-21 LAB — LIPID PANEL
CHOL/HDL RATIO: 3.8 ratio
CHOLESTEROL: 234 mg/dL — AB (ref 0–200)
HDL: 61 mg/dL (ref >=46)
LDL Cholesterol: 118 mg/dL — ABNORMAL HIGH (ref 0–99)
Triglycerides: 273 mg/dL — ABNORMAL HIGH (ref <150)
VLDL: 55 mg/dL — ABNORMAL HIGH (ref 0–40)

## 2015-03-21 LAB — CBC
HEMATOCRIT: 42.8 % (ref 36.0–46.0)
Hemoglobin: 13.8 g/dL (ref 12.0–15.0)
MCH: 29 pg (ref 26.0–34.0)
MCHC: 32.2 g/dL (ref 30.0–36.0)
MCV: 89.9 fL (ref 78.0–100.0)
MPV: 11.1 fL (ref 8.6–12.4)
PLATELETS: 236 10*3/uL (ref 150–400)
RBC: 4.76 MIL/uL (ref 3.87–5.11)
RDW: 15.5 % (ref 11.5–15.5)
WBC: 7.8 10*3/uL (ref 4.0–10.5)

## 2015-03-21 LAB — T-HELPER CELL (CD4) - (RCID CLINIC ONLY)
CD4 % Helper T Cell: 25 % — ABNORMAL LOW (ref 33–55)
CD4 T Cell Abs: 500 /uL (ref 400–2700)

## 2015-03-21 LAB — RPR

## 2015-03-21 LAB — HIV-1 RNA QUANT-NO REFLEX-BLD
HIV 1 RNA Quant: <20 copies/mL (ref <20)
HIV-1 RNA Quant, Log: <1.3 {Log} (ref <1.30)

## 2015-05-07 ENCOUNTER — Other Ambulatory Visit: Payer: Self-pay | Admitting: Internal Medicine

## 2015-06-11 ENCOUNTER — Other Ambulatory Visit: Payer: Self-pay | Admitting: Internal Medicine

## 2015-06-11 DIAGNOSIS — B20 Human immunodeficiency virus [HIV] disease: Secondary | ICD-10-CM

## 2015-07-05 ENCOUNTER — Other Ambulatory Visit: Payer: Self-pay | Admitting: Internal Medicine

## 2015-08-28 ENCOUNTER — Other Ambulatory Visit: Payer: Self-pay | Admitting: Internal Medicine

## 2015-08-28 DIAGNOSIS — B029 Zoster without complications: Secondary | ICD-10-CM

## 2015-09-18 ENCOUNTER — Ambulatory Visit (INDEPENDENT_AMBULATORY_CARE_PROVIDER_SITE_OTHER): Payer: Medicare Other | Admitting: Internal Medicine

## 2015-09-18 ENCOUNTER — Encounter: Payer: Self-pay | Admitting: Internal Medicine

## 2015-09-18 VITALS — BP 117/80 | HR 78 | Temp 97.5°F | Ht 66.0 in | Wt 145.4 lb

## 2015-09-18 DIAGNOSIS — Z23 Encounter for immunization: Secondary | ICD-10-CM

## 2015-09-18 DIAGNOSIS — B20 Human immunodeficiency virus [HIV] disease: Secondary | ICD-10-CM

## 2015-09-18 LAB — COMPREHENSIVE METABOLIC PANEL
ALK PHOS: 71 U/L (ref 33–130)
ALT: 16 U/L (ref 6–29)
AST: 22 U/L (ref 10–35)
Albumin: 4.2 g/dL (ref 3.6–5.1)
BILIRUBIN TOTAL: 3.8 mg/dL — AB (ref 0.2–1.2)
BUN: 11 mg/dL (ref 7–25)
CO2: 25 mmol/L (ref 20–31)
CREATININE: 1.39 mg/dL — AB (ref 0.50–0.99)
Calcium: 10 mg/dL (ref 8.6–10.4)
Chloride: 111 mmol/L — ABNORMAL HIGH (ref 98–110)
GLUCOSE: 70 mg/dL (ref 65–99)
Potassium: 4.2 mmol/L (ref 3.5–5.3)
SODIUM: 145 mmol/L (ref 135–146)
Total Protein: 6.7 g/dL (ref 6.1–8.1)

## 2015-09-18 LAB — LIPID PANEL
Cholesterol: 190 mg/dL (ref 125–200)
HDL: 59 mg/dL (ref >=46)
LDL CALC: 97 mg/dL (ref <130)
Total CHOL/HDL Ratio: 3.2 Ratio (ref <=5.0)
Triglycerides: 169 mg/dL — ABNORMAL HIGH (ref <150)
VLDL: 34 mg/dL — ABNORMAL HIGH (ref <30)

## 2015-09-18 NOTE — Assessment & Plan Note (Signed)
Her adherence is excellent and hasn't resolved her infection has been under excellent control for the past decade. I will continue her current antiretroviral regimen and repeat blood work today. She received her influenza vaccination today. She will follow-up in 6 months.

## 2015-09-18 NOTE — Progress Notes (Signed)
Patient Active Problem List   Diagnosis Date Noted  . Human immunodeficiency virus (HIV) disease (HCC) 07/23/2006    Priority: High  . DYSLIPIDEMIA 02/19/2010    Priority: Medium  . HYPERTENSION NEC 12/25/2009    Priority: Medium  . CIGARETTE SMOKER 05/09/2008    Priority: Medium  . ANXIETY 07/23/2006    Priority: Medium  . SUBSTANCE ABUSE, MULTIPLE 07/23/2006    Priority: Medium  . DEPRESSION 07/23/2006    Priority: Medium  . DYSPLASIA, CERVIX NOS 07/23/2006    Priority: Medium  . GLAUCOMA 05/09/2008  . APPENDECTOMY, HX OF 12/02/2006  . GENITAL HERPES 07/23/2006  . 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 WARTS, OTHER SPECIFIED VIRAL 07/23/2006  . SYPHILIS 07/23/2006  . ANEMIA-NOS 07/23/2006  . PNEUMONIA, RECURRENT 07/23/2006  . DENTAL CARIES 07/23/2006    Patient's Medications  New Prescriptions   No medications on file  Previous Medications   ACYCLOVIR CREAM (ZOVIRAX) 5 %    Apply topically.   BIMATOPROST (LUMIGAN) 0.01 % SOLN    Place 1 drop into both eyes at bedtime.   CALCIUM CARBONATE-VITAMIN D (OYSTER SHELL CALCIUM/D) 250-125 MG-UNIT TABS    Take 1 tablet by mouth.   ECONAZOLE NITRATE 1 % CREAM    Apply topically.   EVOTAZ 300-150 MG PER TABLET    Take 1 tablet by mouth daily. Swallow whole. Do NOT crush, cut or chew tablet. Take with food.   FISH OIL-OMEGA-3 FATTY ACIDS 1000 MG CAPSULE    Take 1 g by mouth daily.   HYDROCODONE-ACETAMINOPHEN (NORCO/VICODIN) 5-325 MG PER TABLET    Take 1 tablet by mouth every 6 (six) hours as needed (for pain).    MULTIPLE VITAMIN (THERA) TABS    Take 1 tablet by mouth.   NAPROXEN (NAPROSYN) 375 MG TABLET    TAKE ONE TABLET BY MOUTH TWICE DAILY with meals   OLOPATADINE (PATANOL) 0.1 % OPHTHALMIC SOLUTION    Place 1 drop into both eyes every morning.   PRAVASTATIN (PRAVACHOL) 20 MG TABLET    TAKE 1 TABLET BY MOUTH EVERY DAY   PROMETHAZINE (PHENERGAN) 25 MG TABLET    Take 1 tablet (25 mg total) by mouth every 6 (six)  hours as needed for nausea or vomiting.   SILVER SULFADIAZINE (SILVADENE) 1 % CREAM    Apply topically 2 (two) times daily.   TIZANIDINE (ZANAFLEX) 4 MG TABLET       TRIUMEQ 600-50-300 MG TABS    Take 1 tablet by mouth daily.   VALACYCLOVIR (VALTREX) 1000 MG TABLET    Take 1/2 tablet (500 mg total) by mouth 2 (two) times daily. for 5 days as needed.  Modified Medications   No medications on file  Discontinued Medications   No medications on file    Subjective: Kara Munoz is in for her routine HIV follow-up visit. She states that she is doing well with the exception of recently having had her pravastatin from 20 mg to 40 mg daily. That occurred 2 weeks ago. She continues to take her Triumeq and Evotaz each evening at 11 PM. She has no side effects and has not missed any doses.   Review of Systems: Review of Systems  Constitutional: Negative for fever, chills, weight loss, malaise/fatigue and diaphoresis.  HENT: Negative for sore throat.   Respiratory: Negative for cough, sputum production and shortness of breath.   Cardiovascular: Negative for chest pain.  Gastrointestinal: Negative for nausea, vomiting and diarrhea.  Genitourinary:  Negative for dysuria and frequency.  Musculoskeletal: Negative for myalgias and joint pain.  Skin: Negative for rash.  Neurological: Negative for focal weakness.  Psychiatric/Behavioral: Negative for depression and substance abuse. The patient is not nervous/anxious.     Past Medical History  Diagnosis Date  . HIV infection (HCC)   . Depression   . Hyperlipidemia     Social History  Substance Use Topics  . Smoking status: Former Smoker -- 0.10 packs/day for 35 years    Types: Cigarettes  . Smokeless tobacco: Never Used     Comment: quit!!  . Alcohol Use: No    No family history on file.  Allergies  Allergen Reactions  . Amoxicillin-Pot Clavulanate Diarrhea    Severe - Pt request it be listed as an allergy    Objective:  Filed Vitals:    09/18/15 1006  BP: 117/80  Pulse: 78  Temp: 97.5 F (36.4 C)  Height: 5\' 6"  (1.676 m)  Weight: 145 lb 6.4 oz (65.953 kg)   Body mass index is 23.48 kg/(m^2).  Physical Exam  Constitutional: She is oriented to person, place, and time.  She is in very good spirits today.  Eyes: Conjunctivae are normal.  Cardiovascular: Normal rate and regular rhythm.   No murmur heard. Pulmonary/Chest: Breath sounds normal.  Neurological: She is alert and oriented to person, place, and time.  Skin: No rash noted.  Psychiatric: Mood and affect normal.    Lab Results Lab Results  Component Value Date   WBC 7.8 03/20/2015   HGB 13.8 03/20/2015   HCT 42.8 03/20/2015   MCV 89.9 03/20/2015   PLT 236 03/20/2015    Lab Results  Component Value Date   CREATININE 1.18* 03/20/2015   BUN 16 03/20/2015   NA 143 03/20/2015   K 4.2 03/20/2015   CL 106 03/20/2015   CO2 29 03/20/2015    Lab Results  Component Value Date   ALT 17 03/20/2015   AST 21 03/20/2015   ALKPHOS 70 03/20/2015   BILITOT 3.0* 03/20/2015    Lab Results  Component Value Date   CHOL 234* 03/20/2015   HDL 61 03/20/2015   LDLCALC 118* 03/20/2015   TRIG 273* 03/20/2015   CHOLHDL 3.8 03/20/2015    Lab Results HIV 1 RNA QUANT (copies/mL)  Date Value  03/20/2015 <20  08/01/2014 <20  01/04/2013 <20   CD4 T CELL ABS  Date Value  03/20/2015 500 /uL  08/01/2014 720 /uL  01/04/2013 480 cmm      Problem List Items Addressed This Visit      High   Human immunodeficiency virus (HIV) disease (HCC)    Her adherence is excellent and hasn't resolved her infection has been under excellent control for the past decade. I will continue her current antiretroviral regimen and repeat blood work today. She received her influenza vaccination today. She will follow-up in 6 months.      Relevant Orders   T-helper cell (CD4)- ([facility] clinic only)   HIV 1 RNA quant-no reflex-bld   CBC   Comprehensive metabolic panel   Lipid panel          Cliffton AstersJohn Carson Meche, MD [facility] [phone] pager   [phone] cell 09/18/2015, 10:21 AM

## 2015-09-19 LAB — CBC
HCT: 43.2 % (ref 36.0–46.0)
Hemoglobin: 14.8 g/dL (ref 12.0–15.0)
MCH: 31.4 pg (ref 26.0–34.0)
MCHC: 34.3 g/dL (ref 30.0–36.0)
MCV: 91.7 fL (ref 78.0–100.0)
MPV: 11.6 fL (ref 8.6–12.4)
Platelets: 249 10*3/uL (ref 150–400)
RBC: 4.71 MIL/uL (ref 3.87–5.11)
RDW: 15 % (ref 11.5–15.5)
WBC: 5.2 10*3/uL (ref 4.0–10.5)

## 2015-09-19 LAB — HIV-1 RNA QUANT-NO REFLEX-BLD
HIV 1 RNA Quant: <20 copies/mL (ref <20)
HIV-1 RNA Quant, Log: <1.3 Log copies/mL (ref <1.30)

## 2015-09-19 LAB — T-HELPER CELL (CD4) - (RCID CLINIC ONLY)
CD4 T CELL ABS: 620 /uL (ref 400–2700)
CD4 T CELL HELPER: 27 % — AB (ref 33–55)

## 2015-10-30 IMAGING — CR DG ABDOMEN ACUTE W/ 1V CHEST
3 series · 3 of 3 positions shown · non-contrast
Comparison: None.

CLINICAL DATA: Abdominal pain.

EXAM:
ACUTE ABDOMEN SERIES (ABDOMEN 2 VIEW & CHEST 1 VIEW)

[w chest pa]
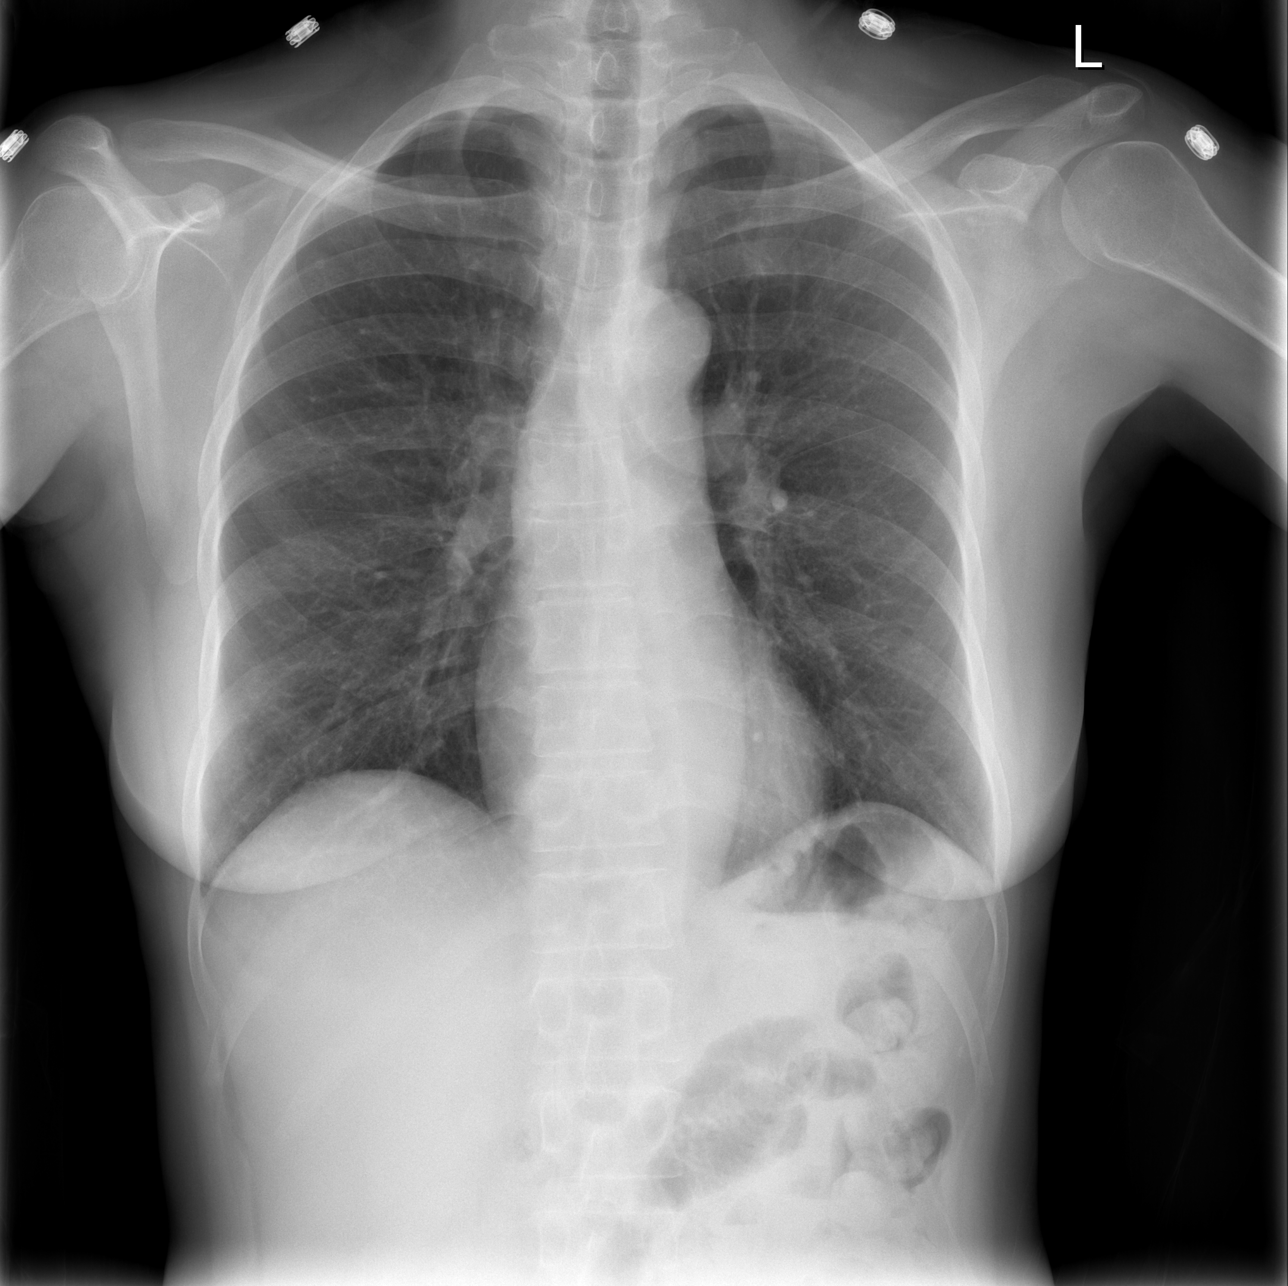

[w abdomen upright]
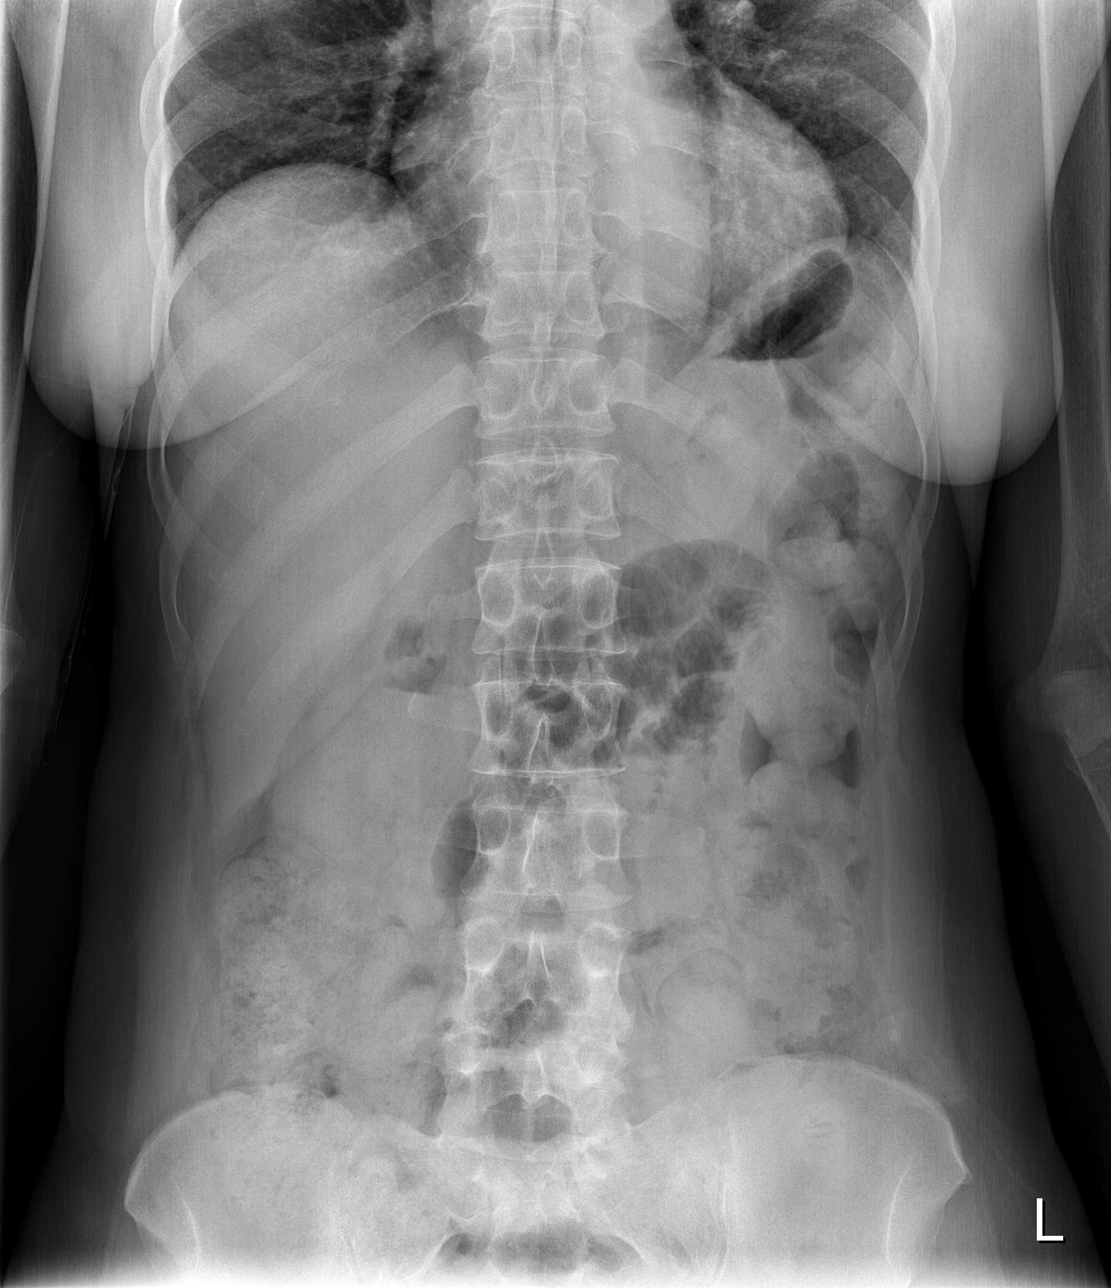

[t abdomen supine]
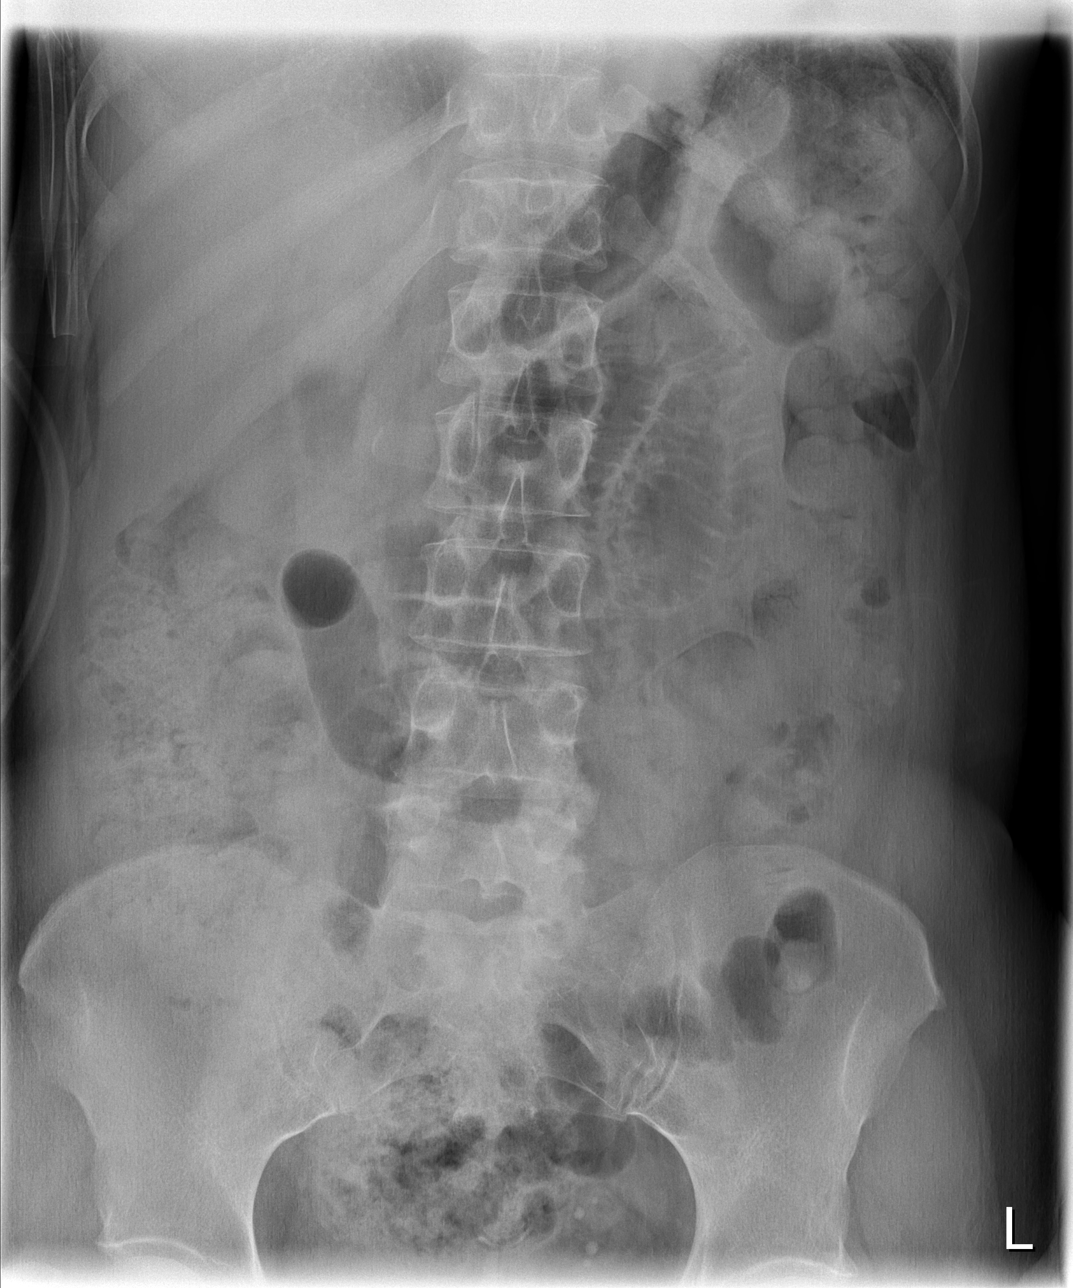

[3 of 3 positions shown; findings below may reference images not displayed]

FINDINGS: There is no evidence of dilated bowel loops or free intraperitoneal
air. Mild amount of stool is noted throughout the colon. No
radiopaque calculi or other significant radiographic abnormality is
seen. Heart size and mediastinal contours are within normal limits.
Both lungs are clear.
IMPRESSION: No evidence of bowel obstruction or ileus. No acute cardiopulmonary
abnormality seen.

## 2016-02-14 ENCOUNTER — Telehealth: Payer: Self-pay | Admitting: *Deleted

## 2016-02-14 NOTE — Telephone Encounter (Signed)
Helmut MusterAlicia, case manager for patient, faxed care plans for signature and return.  Placed in Dr. Blair Dolphinampbell's box. Andree CossHowell, Evaline Waltman M, RN

## 2016-03-06 ENCOUNTER — Telehealth: Payer: Self-pay | Admitting: *Deleted

## 2016-03-06 NOTE — Telephone Encounter (Signed)
Patient called from her Dermatologist office because she was prescribed a short course of prednisone and triamcinolone for hair loss and was told it could affect her CD4 count. Spoke to Black & DeckerMinh Pham, pharmacist and he said this should be fine. Spoke to Derm MD and advised him of this and verified it is a short course. [facility] and Dr. Orvan Falconerampbell. Wendall MolaJacqueline Cambri Plourde

## 2016-03-06 NOTE — Telephone Encounter (Signed)
I have no problem with her taking a short course of prednisone and using triamcinolone topically.

## 2016-03-18 ENCOUNTER — Ambulatory Visit (INDEPENDENT_AMBULATORY_CARE_PROVIDER_SITE_OTHER): Payer: Medicare Other | Admitting: Internal Medicine

## 2016-03-18 ENCOUNTER — Encounter: Payer: Self-pay | Admitting: Internal Medicine

## 2016-03-18 DIAGNOSIS — B20 Human immunodeficiency virus [HIV] disease: Secondary | ICD-10-CM

## 2016-03-18 LAB — COMPREHENSIVE METABOLIC PANEL
ALK PHOS: 76 U/L (ref 33–130)
ALT: 16 U/L (ref 6–29)
AST: 17 U/L (ref 10–35)
Albumin: 4.1 g/dL (ref 3.6–5.1)
BILIRUBIN TOTAL: 2.8 mg/dL — AB (ref 0.2–1.2)
BUN: 12 mg/dL (ref 7–25)
CHLORIDE: 107 mmol/L (ref 98–110)
CO2: 22 mmol/L (ref 20–31)
CREATININE: 1.18 mg/dL — AB (ref 0.50–0.99)
Calcium: 10.3 mg/dL (ref 8.6–10.4)
Glucose, Bld: 75 mg/dL (ref 65–99)
Potassium: 4.4 mmol/L (ref 3.5–5.3)
SODIUM: 142 mmol/L (ref 135–146)
TOTAL PROTEIN: 6.5 g/dL (ref 6.1–8.1)

## 2016-03-18 LAB — CBC
HCT: 43.8 % (ref 35.0–45.0)
HEMOGLOBIN: 14.2 g/dL (ref 11.7–15.5)
MCH: 29.6 pg (ref 27.0–33.0)
MCHC: 32.4 g/dL (ref 32.0–36.0)
MCV: 91.3 fL (ref 80.0–100.0)
MPV: 10.8 fL (ref 7.5–12.5)
PLATELETS: 239 10*3/uL (ref 140–400)
RBC: 4.8 MIL/uL (ref 3.80–5.10)
RDW: 14.4 % (ref 11.0–15.0)
WBC: 7.3 10*3/uL (ref 3.8–10.8)

## 2016-03-18 LAB — LIPID PANEL
CHOL/HDL RATIO: 2.8 ratio (ref <=5.0)
Cholesterol: 185 mg/dL (ref 125–200)
HDL: 65 mg/dL (ref >=46)
LDL Cholesterol: 81 mg/dL (ref <130)
Triglycerides: 194 mg/dL — ABNORMAL HIGH (ref <150)
VLDL: 39 mg/dL — ABNORMAL HIGH (ref <30)

## 2016-03-18 NOTE — Progress Notes (Signed)
Patient Active Problem List   Diagnosis Date Noted  . Human immunodeficiency virus (HIV) disease (HCC) 07/23/2006    Priority: High  . DYSLIPIDEMIA 02/19/2010    Priority: Medium  . HYPERTENSION NEC 12/25/2009    Priority: Medium  . CIGARETTE SMOKER 05/09/2008    Priority: Medium  . ANXIETY 07/23/2006    Priority: Medium  . SUBSTANCE ABUSE, MULTIPLE 07/23/2006    Priority: Medium  . DEPRESSION 07/23/2006    Priority: Medium  . DYSPLASIA, CERVIX NOS 07/23/2006    Priority: Medium  . GLAUCOMA 05/09/2008  . APPENDECTOMY, HX OF 12/02/2006  . GENITAL HERPES 07/23/2006  . 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 WARTS, OTHER SPECIFIED VIRAL 07/23/2006  . SYPHILIS 07/23/2006  . ANEMIA-NOS 07/23/2006  . PNEUMONIA, RECURRENT 07/23/2006  . DENTAL CARIES 07/23/2006    Patient's Medications  New Prescriptions   No medications on file  Previous Medications   ACYCLOVIR CREAM (ZOVIRAX) 5 %    Apply topically.   ALBUTEROL (PROAIR HFA) 108 (90 BASE) MCG/ACT INHALER    Inhale into the lungs.   B COMPLEX VITAMINS (VITAMIN-B COMPLEX) TABS    Take by mouth.   BIMATOPROST (LUMIGAN) 0.01 % SOLN    Place 1 drop into both eyes at bedtime.   BIOTIN 1 MG CAPS    Take by mouth.   CALCIUM CARBONATE-VITAMIN D (OYSTER SHELL CALCIUM/D) 250-125 MG-UNIT TABS    Take 1 tablet by mouth.   CETIRIZINE (ZYRTEC) 10 MG TABLET    Take by mouth.   CYANOCOBALAMIN 500 MCG TABLET    Take by mouth.   DOCUSATE (COLACE) 50 MG/5ML LIQUID    Take by mouth.   ECONAZOLE NITRATE 1 % CREAM    Apply topically.   EVOTAZ 300-150 MG PER TABLET    Take 1 tablet by mouth daily. Swallow whole. Do NOT crush, cut or chew tablet. Take with food.   FISH OIL-OMEGA-3 FATTY ACIDS 1000 MG CAPSULE    Take 1 g by mouth daily.   HYDROCODONE-ACETAMINOPHEN (NORCO/VICODIN) 5-325 MG PER TABLET    Take 1 tablet by mouth every 6 (six) hours as needed (for pain).    MOMETASONE (NASONEX) 50 MCG/ACT NASAL SPRAY       MULTIPLE VITAMIN  (THERA) TABS    Take 1 tablet by mouth.   NAPROXEN (NAPROSYN) 375 MG TABLET    TAKE ONE TABLET BY MOUTH TWICE DAILY with meals   PAZEO 0.7 % SOLN    INSTILL 1 DROP IN BOTH EYES DAILY IN THE MORNING.   PRAVASTATIN (PRAVACHOL) 40 MG TABLET       PREDNISONE (DELTASONE) 20 MG TABLET    2 tablets by mouth for three days then 1 tablet by mouth for 3 days   PSEUDOEPHEDRINE (SUDAFED) 30 MG TABLET    Take 30 mg by mouth.   SILVER SULFADIAZINE (SILVADENE) 1 % CREAM    Apply topically 2 (two) times daily.   TRIAMCINOLONE CREAM (KENALOG) 0.1 %    apply TWO TIMES DAILY   TRIUMEQ 600-50-300 MG TABS    Take 1 tablet by mouth daily.   VALACYCLOVIR (VALTREX) 1000 MG TABLET    Take 1/2 tablet (500 mg total) by mouth 2 (two) times daily. for 5 days as needed.   VITAMIN E 200 UNIT CAPSULE    Take by mouth.  Modified Medications   No medications on file  Discontinued Medications   OLOPATADINE (PATANOL) 0.1 % OPHTHALMIC SOLUTION  Place 1 drop into both eyes every morning.   PRAVASTATIN (PRAVACHOL) 20 MG TABLET    TAKE 1 TABLET BY MOUTH EVERY DAY   PROMETHAZINE (PHENERGAN) 25 MG TABLET    Take 1 tablet (25 mg total) by mouth every 6 (six) hours as needed for nausea or vomiting.   TIZANIDINE (ZANAFLEX) 4 MG TABLET        Subjective: Kara Munoz is in for her routine HIV follow-up visit. She recently developed pruritus in her scalp and was seen by her dermatologist. She was told that she had some type of dermatitis and was started on oral prednisone for 3 days. The itching resolved promptly but she has been much more fatigued and wonders if it is due to the prednisone. She has also been concerned that the prednisone has caused her HIV infection to come out of control although she has not missed any doses of her Triumeq or Evotaz. She has been feeling slightly more down recently because of her health concerns. She has not been able to stop smoking cigarettes.  Review of Systems: Review of Systems  Constitutional:  Negative for fever, chills, weight loss, malaise/fatigue and diaphoresis.  HENT: Negative for sore throat.   Respiratory: Negative for cough, sputum production and shortness of breath.   Cardiovascular: Negative for chest pain.  Gastrointestinal: Negative for nausea, vomiting and diarrhea.  Genitourinary: Negative for dysuria and frequency.  Musculoskeletal: Negative for myalgias and joint pain.  Skin: Positive for itching. Negative for rash.  Neurological: Negative for dizziness and headaches.  Psychiatric/Behavioral: Positive for depression. Negative for substance abuse. The patient is not nervous/anxious.     Past Medical History  Diagnosis Date  . HIV infection (HCC)   . Depression   . Hyperlipidemia     Social History  Substance Use Topics  . Smoking status: Former Smoker -- 0.10 packs/day for 35 years    Types: Cigarettes  . Smokeless tobacco: Never Used     Comment: quit!!  . Alcohol Use: No    No family history on file.  Allergies  Allergen Reactions  . Amoxicillin-Pot Clavulanate Diarrhea    Severe - Pt request it be listed as an allergy    Objective:  Filed Vitals:   03/18/16 1020  BP: 134/92  Pulse: 71  Temp: 98.1 F (36.7 C)  TempSrc: Oral  Weight: 144 lb 12.8 oz (65.681 kg)   Body mass index is 23.38 kg/(m^2).  Physical Exam  Constitutional: She is oriented to person, place, and time.  She is in very good spirits.  HENT:  Mouth/Throat: No oropharyngeal exudate.  Eyes: Conjunctivae are normal.  Cardiovascular: Normal rate and regular rhythm.   No murmur heard. Pulmonary/Chest: Breath sounds normal.  Abdominal: Soft. She exhibits no mass. There is no tenderness.  Musculoskeletal: Normal range of motion.  Neurological: She is alert and oriented to person, place, and time.  Skin: No rash noted.  She is wearing a wig. She has a flat, hyperpigmented patch on her left cheek.  Psychiatric: Mood and affect normal.    Lab Results Lab Results    Component Value Date   WBC 5.2 09/18/2015   HGB 14.8 09/18/2015   HCT 43.2 09/18/2015   MCV 91.7 09/18/2015   PLT 249 09/18/2015    Lab Results  Component Value Date   CREATININE 1.39* 09/18/2015   BUN 11 09/18/2015   NA 145 09/18/2015   K 4.2 09/18/2015   CL 111* 09/18/2015   CO2 25 09/18/2015  Lab Results  Component Value Date   ALT 16 09/18/2015   AST 22 09/18/2015   ALKPHOS 71 09/18/2015   BILITOT 3.8* 09/18/2015    Lab Results  Component Value Date   CHOL 190 09/18/2015   HDL 59 09/18/2015   LDLCALC 97 09/18/2015   TRIG 169* 09/18/2015   CHOLHDL 3.2 09/18/2015    Lab Results HIV 1 RNA QUANT (copies/mL)  Date Value  09/18/2015 <20  03/20/2015 <20  08/01/2014 <20   CD4 T CELL ABS (/uL)  Date Value  09/18/2015 620  03/20/2015 500  08/01/2014 720      Problem List Items Addressed This Visit      High   Human immunodeficiency virus (HIV) disease (HCC)    It sounds like her adherence remains excellent. I expect that her infection is still under very good control. I will continue her current antiretroviral regimen, repeat lab work today and see her back in 6 months.      Relevant Orders   T-helper cell (CD4)- ([facility] clinic only)   HIV 1 RNA quant-no reflex-bld   CBC   Comprehensive metabolic panel   Lipid panel   RPR   T-helper cell (CD4)- ([facility] clinic only)   HIV 1 RNA quant-no reflex-bld   CBC   Comprehensive metabolic panel   RPR   Lipid panel        Cliffton Asters, MD [facility] [phone] pager   [phone] cell 03/18/2016, 10:35 AM

## 2016-03-18 NOTE — Assessment & Plan Note (Signed)
It sounds like her adherence remains excellent. I expect that her infection is still under very good control. I will continue her current antiretroviral regimen, repeat lab work today and see her back in 6 months.

## 2016-03-19 LAB — RPR

## 2016-03-19 LAB — T-HELPER CELL (CD4) - (RCID CLINIC ONLY)
CD4 % Helper T Cell: 27 % — ABNORMAL LOW (ref 33–55)
CD4 T Cell Abs: 880 /uL (ref 400–2700)

## 2016-03-19 LAB — HIV-1 RNA QUANT-NO REFLEX-BLD: HIV 1 RNA Quant: <20 copies/mL (ref <20)

## 2016-03-25 ENCOUNTER — Telehealth: Payer: Self-pay | Admitting: *Deleted

## 2016-03-25 NOTE — Telephone Encounter (Addendum)
Voice Mail received from Kara Munoz with [facility] requesting Rx for Boost. This is not currently on patient's med list and her last BMI was 23. Called back and got her voice mail and advised I can not send refill until Dr. Orvan Falconerampbell approves. Kara Munoz called back and said it had not been on their med list either and the patient called her and requested it. Please advise

## 2016-03-26 ENCOUNTER — Telehealth: Payer: Self-pay | Admitting: *Deleted

## 2016-03-26 MED ORDER — NUTRITIONAL SUPPLEMENT PO LIQD: 1.0000 | Freq: Two times a day (BID) | ORAL | Status: DC

## 2016-03-26 NOTE — Telephone Encounter (Signed)
Patient requesting rx for Boost to be faxed to [facility] in [place]  [facility]

## 2016-05-28 ENCOUNTER — Other Ambulatory Visit: Payer: Self-pay | Admitting: Internal Medicine

## 2016-05-28 DIAGNOSIS — B20 Human immunodeficiency virus [HIV] disease: Secondary | ICD-10-CM

## 2016-09-23 ENCOUNTER — Encounter: Payer: Self-pay | Admitting: Internal Medicine

## 2016-09-23 ENCOUNTER — Ambulatory Visit (INDEPENDENT_AMBULATORY_CARE_PROVIDER_SITE_OTHER): Payer: Medicare Other | Admitting: Internal Medicine

## 2016-09-23 DIAGNOSIS — F1910 Other psychoactive substance abuse, uncomplicated: Secondary | ICD-10-CM | POA: Diagnosis not present

## 2016-09-23 DIAGNOSIS — B20 Human immunodeficiency virus [HIV] disease: Secondary | ICD-10-CM | POA: Diagnosis not present

## 2016-09-23 DIAGNOSIS — F321 Major depressive disorder, single episode, moderate: Secondary | ICD-10-CM

## 2016-09-23 DIAGNOSIS — F17200 Nicotine dependence, unspecified, uncomplicated: Secondary | ICD-10-CM

## 2016-09-23 MED ORDER — PAROXETINE HCL 20 MG PO TABS: 20.0000 mg | ORAL_TABLET | Freq: Every day | ORAL | 11 refills | Status: DC

## 2016-09-23 NOTE — Assessment & Plan Note (Signed)
Her adherence remains excellent and her infection has been under very good long-term control. I will get repeat lab work today. She will continue on Triumeq and Evotaz in follow-up in 6 months.

## 2016-09-23 NOTE — Assessment & Plan Note (Addendum)
She's had a relapse of her depression. She used to be on some antidepressant that was prescribed by her primary care provider, Dr. Jordan LikesSpivey, many years ago but it made her feel jittery and she stopped it after several weeks. She would like to try a new antidepressant. I agreed to start her on paroxetine. There is a potential drug drug interaction with Evotaz which can increase the potency of proximal 18 so I will start with 20 mg daily.

## 2016-09-23 NOTE — Progress Notes (Signed)
Patient Active Problem List   Diagnosis Date Noted  . Human immunodeficiency virus (HIV) disease (HCC) 07/23/2006    Priority: High  . DYSLIPIDEMIA 02/19/2010    Priority: Medium  . HYPERTENSION NEC 12/25/2009    Priority: Medium  . CIGARETTE SMOKER 05/09/2008    Priority: Medium  . ANXIETY 07/23/2006    Priority: Medium  . Polysubstance abuse 07/23/2006    Priority: Medium  . Depression 07/23/2006    Priority: Medium  . DYSPLASIA, CERVIX NOS 07/23/2006    Priority: Medium  . GLAUCOMA 05/09/2008  . APPENDECTOMY, HX OF 12/02/2006  . GENITAL HERPES 07/23/2006  . 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 WARTS, OTHER SPECIFIED VIRAL 07/23/2006  . SYPHILIS 07/23/2006  . ANEMIA-NOS 07/23/2006  . PNEUMONIA, RECURRENT 07/23/2006  . DENTAL CARIES 07/23/2006    Patient's Medications  New Prescriptions   PAROXETINE (PAXIL) 20 MG TABLET    Take 1 tablet (20 mg total) by mouth daily.  Previous Medications   ACYCLOVIR CREAM (ZOVIRAX) 5 %    Apply topically.   ALBUTEROL (PROAIR HFA) 108 (90 BASE) MCG/ACT INHALER    Inhale into the lungs.   B COMPLEX VITAMINS (VITAMIN-B COMPLEX) TABS    Take by mouth.   BIMATOPROST (LUMIGAN) 0.01 % SOLN    Place 1 drop into both eyes at bedtime.   BIOTIN 1 MG CAPS    Take by mouth.   CALCIUM CARBONATE-VITAMIN D (OYSTER SHELL CALCIUM/D) 250-125 MG-UNIT TABS    Take 1 tablet by mouth.   CETIRIZINE (ZYRTEC) 10 MG TABLET    Take by mouth.   CYANOCOBALAMIN 500 MCG TABLET    Take by mouth.   DOCUSATE (COLACE) 50 MG/5ML LIQUID    Take by mouth.   ECONAZOLE NITRATE 1 % CREAM    Apply topically.   EVOTAZ 300-150 MG TABLET    Take 1 tablet by mouth daily. Swallow whole. Do NOT crush, cut or chew tablet. Take with food.   FISH OIL-OMEGA-3 FATTY ACIDS 1000 MG CAPSULE    Take 1 g by mouth daily.   HYDROCODONE-ACETAMINOPHEN (NORCO/VICODIN) 5-325 MG PER TABLET    Take 1 tablet by mouth every 6 (six) hours as needed (for pain).    MOMETASONE (NASONEX) 50  MCG/ACT NASAL SPRAY       MULTIPLE VITAMIN (THERA) TABS    Take 1 tablet by mouth.   NAPROXEN (NAPROSYN) 375 MG TABLET    TAKE ONE TABLET BY MOUTH TWICE DAILY with meals   NUTRITIONAL SUPPLEMENT LIQD    Take 1 Can by mouth 2 (two) times daily. Boost supplement, chocolate flavor   PAZEO 0.7 % SOLN    INSTILL 1 DROP IN BOTH EYES DAILY IN THE MORNING.   PRAVASTATIN (PRAVACHOL) 40 MG TABLET       PSEUDOEPHEDRINE (SUDAFED) 30 MG TABLET    Take 30 mg by mouth.   SILVER SULFADIAZINE (SILVADENE) 1 % CREAM    Apply topically 2 (two) times daily.   TRIAMCINOLONE CREAM (KENALOG) 0.1 %    apply TWO TIMES DAILY   TRIUMEQ 600-50-300 MG TABLET    Take 1 tablet by mouth daily.   VALACYCLOVIR (VALTREX) 1000 MG TABLET    Take 1/2 tablet (500 mg total) by mouth 2 (two) times daily. for 5 days as needed.   VITAMIN E 200 UNIT CAPSULE    Take by mouth.  Modified Medications   No medications on file  Discontinued Medications   PREDNISONE (  DELTASONE) 20 MG TABLET    2 tablets by mouth for three days then 1 tablet by mouth for 3 days    Subjective: Kara Munoz is in for her routine HIV follow-up visit. As usual, she says that she never misses a single dose of her Triumeq or Evotaz. She has recently been feeling depressed again. She's been in a 16 year relationship with her partner but they have drifted apart. He sleeps in a separate bedroom. She would like for them to be closer but she is thinking that she may need to break the relationship often ask him to move out. She is also stressed over the fact that she does not like where she is living. It [place] in a safe part of town but she would like a nicer place. She is working with her case Production designer, theatre/television/filmmanager to try to find a new apartment. She's been having a little bit of trouble falling asleep and staying asleep. She is concerned that she has recently lost some weight. She started smoking cigarettes again.   Review of Systems: Review of Systems  Constitutional: Positive for  malaise/fatigue and weight loss. Negative for chills, diaphoresis and fever.  HENT: Negative for sore throat.   Respiratory: Negative for cough, sputum production and shortness of breath.   Cardiovascular: Negative for chest pain.  Gastrointestinal: Negative for abdominal pain, diarrhea, heartburn, nausea and vomiting.  Genitourinary: Negative for dysuria and frequency.  Musculoskeletal: Negative for joint pain and myalgias.  Skin: Negative for rash.  Neurological: Negative for dizziness and headaches.  Psychiatric/Behavioral: Positive for depression. Negative for substance abuse and suicidal ideas. The patient has insomnia. The patient is not nervous/anxious.     Past Medical History:  Diagnosis Date  . Depression   . HIV infection (HCC)   . Hyperlipidemia     Social History  Substance Use Topics  . Smoking status: Current Some Day Smoker    Packs/day: 0.10    Years: 35.00    Types: Cigarettes  . Smokeless tobacco: Never Used     Comment: quit!!  . Alcohol use No    No family history on file.  Allergies  Allergen Reactions  . Amoxicillin-Pot Clavulanate Diarrhea    Severe - Pt request it be listed as an allergy    Objective:  Vitals:   09/23/16 0958  BP: (!) 156/94  Pulse: 69  Temp: 97.5 F (36.4 C)  TempSrc: Oral  Weight: 137 lb 8 oz (62.4 kg)  Height: 5' 6.5" (1.689 m)   Body mass index is 21.86 kg/m.  Physical Exam  Constitutional: She is oriented to person, place, and time.  She is in good spirits.  HENT:  Mouth/Throat: No oropharyngeal exudate.  She has full dentures  Eyes: Conjunctivae are normal.  Cardiovascular: Normal rate and regular rhythm.   No murmur heard. Pulmonary/Chest: Effort normal and breath sounds normal. She has no wheezes. She has no rales.  Abdominal: Soft. She exhibits no mass. There is no tenderness.  Musculoskeletal: Normal range of motion.  Neurological: She is alert and oriented to person, place, and time. Gait normal.    Skin: No rash noted.  Psychiatric: Mood and affect normal.    Lab Results Lab Results  Component Value Date   WBC 7.3 03/18/2016   HGB 14.2 03/18/2016   HCT 43.8 03/18/2016   MCV 91.3 03/18/2016   PLT 239 03/18/2016    Lab Results  Component Value Date   CREATININE 1.18 (H) 03/18/2016   BUN 12 03/18/2016  NA 142 03/18/2016   K 4.4 03/18/2016   CL 107 03/18/2016   CO2 22 03/18/2016    Lab Results  Component Value Date   ALT 16 03/18/2016   AST 17 03/18/2016   ALKPHOS 76 03/18/2016   BILITOT 2.8 (H) 03/18/2016    Lab Results  Component Value Date   CHOL 185 03/18/2016   HDL 65 03/18/2016   LDLCALC 81 03/18/2016   TRIG 194 (H) 03/18/2016   CHOLHDL 2.8 03/18/2016   HIV 1 RNA Quant (copies/mL)  Date Value  03/18/2016 <20  09/18/2015 <20  03/20/2015 <20   CD4 T Cell Abs (/uL)  Date Value  03/18/2016 880  09/18/2015 620  03/20/2015 500     Problem List Items Addressed This Visit      High   Human immunodeficiency virus (HIV) disease (HCC)    Her adherence remains excellent and her infection has been under very good long-term control. I will get repeat lab work today. She will continue on Triumeq and Evotaz in follow-up in 6 months.      Relevant Orders   T-helper cell (CD4)- ([facility] only)   HIV 1 RNA quant-no reflex-bld     Medium   CIGARETTE SMOKER    I encouraged her to consider cigarette cessation again.      Depression    She's had a relapse of her depression. She used to be on some antidepressant that was prescribed by her primary care provider, Dr. Jordan Likes, many years ago but it made her feel jittery and she stopped it after several weeks. She would like to try a new antidepressant. I agreed to start her on paroxetine. There is a potential drug drug interaction with Evotaz which can increase the potency of proximal 18 so I will start with 20 mg daily.      Relevant Medications   PARoxetine (PAXIL) 20 MG tablet   Polysubstance abuse     Although her depression has relapsed she remains sober and off of drugs. She has been drug free for over a decade now.           Cliffton Asters, MD [facility] [phone] pager   [phone] cell 09/23/2016, 10:46 AM

## 2016-09-23 NOTE — Assessment & Plan Note (Signed)
I encouraged her to consider cigarette cessation again.

## 2016-09-23 NOTE — Assessment & Plan Note (Signed)
Although her depression has relapsed she remains sober and off of drugs. She has been drug free for over a decade now.

## 2016-09-24 LAB — T-HELPER CELL (CD4) - (RCID CLINIC ONLY)
CD4 % Helper T Cell: 26 % — ABNORMAL LOW (ref 33–55)
CD4 T CELL ABS: 780 /uL (ref 400–2700)

## 2016-09-25 LAB — HIV-1 RNA QUANT-NO REFLEX-BLD: HIV-1 RNA Quant, Log: <1.3 Log copies/mL (ref <1.30)

## 2016-11-11 ENCOUNTER — Telehealth: Payer: Self-pay | Admitting: *Deleted

## 2016-11-11 NOTE — Telephone Encounter (Signed)
Patient is requesting a refill of her Boost prescription to be sent to her case manager, [employer].  Patient does not have phone number or fax number to her CM, will have them fax us the request. Andree CossHowell, Emilyrose Darrah M, RN

## 2016-11-12 ENCOUNTER — Other Ambulatory Visit: Payer: Self-pay | Admitting: *Deleted

## 2016-11-12 DIAGNOSIS — B20 Human immunodeficiency virus [HIV] disease: Secondary | ICD-10-CM

## 2016-11-12 MED ORDER — NUTRITIONAL SUPPLEMENT PO LIQD: 1.0000 | Freq: Two times a day (BID) | ORAL | 11 refills | Status: DC

## 2016-11-12 NOTE — Telephone Encounter (Signed)
Yes

## 2016-11-12 NOTE — Telephone Encounter (Signed)
[facility] Phone: [phone] Fax: [phone]

## 2016-11-18 ENCOUNTER — Other Ambulatory Visit: Payer: Self-pay | Admitting: Internal Medicine

## 2016-11-18 DIAGNOSIS — B20 Human immunodeficiency virus [HIV] disease: Secondary | ICD-10-CM

## 2017-03-26 ENCOUNTER — Encounter: Payer: Self-pay | Admitting: Internal Medicine

## 2017-03-26 ENCOUNTER — Ambulatory Visit (INDEPENDENT_AMBULATORY_CARE_PROVIDER_SITE_OTHER): Payer: Medicare Other | Admitting: Internal Medicine

## 2017-03-26 DIAGNOSIS — F17200 Nicotine dependence, unspecified, uncomplicated: Secondary | ICD-10-CM

## 2017-03-26 DIAGNOSIS — F3340 Major depressive disorder, recurrent, in remission, unspecified: Secondary | ICD-10-CM

## 2017-03-26 DIAGNOSIS — B20 Human immunodeficiency virus [HIV] disease: Secondary | ICD-10-CM | POA: Diagnosis not present

## 2017-03-26 LAB — COMPREHENSIVE METABOLIC PANEL
ALT: 23 U/L (ref 6–29)
AST: 18 U/L (ref 10–35)
Albumin: 3.9 g/dL (ref 3.6–5.1)
Alkaline Phosphatase: 84 U/L (ref 33–130)
BUN: 17 mg/dL (ref 7–25)
CALCIUM: 10.2 mg/dL (ref 8.6–10.4)
CHLORIDE: 108 mmol/L (ref 98–110)
CO2: 25 mmol/L (ref 20–31)
Creat: 1.23 mg/dL — ABNORMAL HIGH (ref 0.50–0.99)
GLUCOSE: 73 mg/dL (ref 65–99)
POTASSIUM: 4.1 mmol/L (ref 3.5–5.3)
Sodium: 141 mmol/L (ref 135–146)
Total Bilirubin: 2.4 mg/dL — ABNORMAL HIGH (ref 0.2–1.2)
Total Protein: 6.5 g/dL (ref 6.1–8.1)

## 2017-03-26 LAB — CBC
HEMATOCRIT: 41.4 % (ref 35.0–45.0)
Hemoglobin: 13.1 g/dL (ref 11.7–15.5)
MCH: 29.2 pg (ref 27.0–33.0)
MCHC: 31.6 g/dL — AB (ref 32.0–36.0)
MCV: 92.2 fL (ref 80.0–100.0)
MPV: 10.4 fL (ref 7.5–12.5)
Platelets: 235 10*3/uL (ref 140–400)
RBC: 4.49 MIL/uL (ref 3.80–5.10)
RDW: 15.3 % — AB (ref 11.0–15.0)
WBC: 6.2 10*3/uL (ref 3.8–10.8)

## 2017-03-26 LAB — LIPID PANEL
Cholesterol: 189 mg/dL (ref <200)
HDL: 63 mg/dL (ref >50)
LDL CALC: 99 mg/dL (ref <100)
TRIGLYCERIDES: 135 mg/dL (ref <150)
Total CHOL/HDL Ratio: 3 Ratio (ref <5.0)
VLDL: 27 mg/dL (ref <30)

## 2017-03-26 NOTE — Assessment & Plan Note (Signed)
Her depression is in remission. 

## 2017-03-26 NOTE — Assessment & Plan Note (Signed)
I encouraged her to consider cigarette cessation again. 

## 2017-03-26 NOTE — Assessment & Plan Note (Signed)
Her infection has been under excellent long-term control. She will get blood work today and continue IT sales professionalTriumeq and Evotaz. She will follow-up in 6 months.

## 2017-03-26 NOTE — Addendum Note (Signed)
Addended by: Andree CossHOWELL, Aljean Horiuchi M on: 03/26/2017 09:46 AM   Modules accepted: Orders

## 2017-03-26 NOTE — Progress Notes (Signed)
Patient Active Problem List   Diagnosis Date Noted  . Human immunodeficiency virus (HIV) disease (HCC) 07/23/2006    Priority: High  . DYSLIPIDEMIA 02/19/2010    Priority: Medium  . HYPERTENSION NEC 12/25/2009    Priority: Medium  . CIGARETTE SMOKER 05/09/2008    Priority: Medium  . ANXIETY 07/23/2006    Priority: Medium  . Polysubstance abuse 07/23/2006    Priority: Medium  . Depression 07/23/2006    Priority: Medium  . DYSPLASIA, CERVIX NOS 07/23/2006    Priority: Medium  . GLAUCOMA 05/09/2008  . APPENDECTOMY, HX OF 12/02/2006  . GENITAL HERPES 07/23/2006  . 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 WARTS, OTHER SPECIFIED VIRAL 07/23/2006  . SYPHILIS 07/23/2006  . ANEMIA-NOS 07/23/2006  . PNEUMONIA, RECURRENT 07/23/2006  . DENTAL CARIES 07/23/2006    Patient's Medications  New Prescriptions   No medications on file  Previous Medications   ACYCLOVIR CREAM (ZOVIRAX) 5 %    Apply topically.   ALBUTEROL (PROAIR HFA) 108 (90 BASE) MCG/ACT INHALER    Inhale into the lungs.   B COMPLEX VITAMINS (VITAMIN-B COMPLEX) TABS    Take by mouth.   BIMATOPROST (LUMIGAN) 0.01 % SOLN    Place 1 drop into both eyes at bedtime.   BIOTIN 1 MG CAPS    Take by mouth.   CALCIUM CARBONATE-VITAMIN D (OYSTER SHELL CALCIUM/D) 250-125 MG-UNIT TABS    Take 1 tablet by mouth.   CETIRIZINE (ZYRTEC) 10 MG TABLET    Take by mouth.   CYANOCOBALAMIN 500 MCG TABLET    Take by mouth.   DOCUSATE (COLACE) 50 MG/5ML LIQUID    Take by mouth.   ECONAZOLE NITRATE 1 % CREAM    Apply topically.   EVOTAZ 300-150 MG TABLET    Take 1 tablet by mouth daily. Swallow whole. Do NOT crush, cut or chew tablet. Take with food.   FISH OIL-OMEGA-3 FATTY ACIDS 1000 MG CAPSULE    Take 1 g by mouth daily.   HYDROCODONE-ACETAMINOPHEN (NORCO/VICODIN) 5-325 MG PER TABLET    Take 1 tablet by mouth every 6 (six) hours as needed (for pain).    MOMETASONE (NASONEX) 50 MCG/ACT NASAL SPRAY       MULTIPLE VITAMIN (THERA) TABS     Take 1 tablet by mouth.   NAPROXEN (NAPROSYN) 375 MG TABLET    TAKE ONE TABLET BY MOUTH TWICE DAILY with meals   NUTRITIONAL SUPPLEMENT LIQD    Take 1 Can by mouth 2 (two) times daily. Boost supplement, chocolate flavor   PAZEO 0.7 % SOLN    INSTILL 1 DROP IN BOTH EYES DAILY IN THE MORNING.   PRAVASTATIN (PRAVACHOL) 40 MG TABLET       PSEUDOEPHEDRINE (SUDAFED) 30 MG TABLET    Take 30 mg by mouth.   SILVER SULFADIAZINE (SILVADENE) 1 % CREAM    Apply topically 2 (two) times daily.   TRIAMCINOLONE CREAM (KENALOG) 0.1 %    apply TWO TIMES DAILY   TRIUMEQ 600-50-300 MG TABLET    Take 1 tablet by mouth daily.   VALACYCLOVIR (VALTREX) 1000 MG TABLET    Take 1/2 tablet (500 mg total) by mouth 2 (two) times daily. for 5 days as needed.   VITAMIN E 200 UNIT CAPSULE    Take by mouth.  Modified Medications   No medications on file  Discontinued Medications   PAROXETINE (PAXIL) 20 MG TABLET    Take 1 tablet (20 mg total)  by mouth daily.    Subjective: Kara Munoz is in for her routine HIV follow-up visit. She has had no problems obtaining, taking or tolerating her Triumeq and Evotaz. She does not recall missing doses. She states that she is not feeling nearly as depressed as she was at the time of her last visit. She stopped taking piroxicam a few weeks after starting it because she did not like the way it made her feel. She is now a member of a new church and has been speaking regularly with her pastor and feels that that has relieved her depression. [facility]. She has stopped eating pork. She wonders why she has been losing weight. Overall she is feeling much better. She continues to smoke cigarettes and has no current plans to quit.   Review of Systems: Review of Systems  Constitutional: Positive for weight loss. Negative for chills, diaphoresis, fever and malaise/fatigue.  HENT: Negative for sore throat.   Respiratory: Negative for cough, sputum production and  shortness of breath.   Cardiovascular: Negative for chest pain.  Gastrointestinal: Negative for abdominal pain, diarrhea, heartburn, nausea and vomiting.  Genitourinary: Negative for dysuria and frequency.  Musculoskeletal: Negative for joint pain and myalgias.  Skin: Negative for rash.  Neurological: Negative for dizziness and headaches.  Psychiatric/Behavioral: Negative for depression and substance abuse. The patient is not nervous/anxious.     Past Medical History:  Diagnosis Date  . Depression   . HIV infection (HCC)   . Hyperlipidemia     Social History  Substance Use Topics  . Smoking status: Current Some Day Smoker    Packs/day: 0.10    Years: 35.00    Types: Cigarettes  . Smokeless tobacco: Never Used     Comment: quit!!  . Alcohol use No    No family history on file.  Allergies  Allergen Reactions  . Amoxicillin-Pot Clavulanate Diarrhea    Severe - Pt request it be listed as an allergy    Objective:  Vitals:   03/26/17 0858  Weight: 129 lb (58.5 kg)  Height: 5' 6.5" (1.689 m)   Body mass index is 20.51 kg/m.  Physical Exam  Constitutional: She is oriented to person, place, and time.  Her weight is down 12 pounds. She is talkative and in good spirits as usual.  HENT:  Mouth/Throat: No oropharyngeal exudate.  Eyes: Conjunctivae are normal.  Cardiovascular: Normal rate and regular rhythm.   No murmur heard. Pulmonary/Chest: Effort normal and breath sounds normal.  Abdominal: Soft. She exhibits no mass. There is no tenderness.  Musculoskeletal: Normal range of motion.  Neurological: She is alert and oriented to person, place, and time.  Skin: No rash noted.  Psychiatric: Mood and affect normal.    Lab Results Lab Results  Component Value Date   WBC 7.3 03/18/2016   HGB 14.2 03/18/2016   HCT 43.8 03/18/2016   MCV 91.3 03/18/2016   PLT 239 03/18/2016    Lab Results  Component Value Date   CREATININE 1.18 (H) 03/18/2016   BUN 12 03/18/2016     NA 142 03/18/2016   K 4.4 03/18/2016   CL 107 03/18/2016   CO2 22 03/18/2016    Lab Results  Component Value Date   ALT 16 03/18/2016   AST 17 03/18/2016   ALKPHOS 76 03/18/2016   BILITOT 2.8 (H) 03/18/2016    Lab Results  Component Value Date   CHOL 185 03/18/2016  HDL 65 03/18/2016   LDLCALC 81 03/18/2016   TRIG 194 (H) 03/18/2016   CHOLHDL 2.8 03/18/2016   HIV 1 RNA Quant (copies/mL)  Date Value  09/23/2016 <20  03/18/2016 <20  09/18/2015 <20   CD4 T Cell Abs (/uL)  Date Value  09/23/2016 780  03/18/2016 880  09/18/2015 620     Problem List Items Addressed This Visit      High   Human immunodeficiency virus (HIV) disease (HCC)    Her infection has been under excellent long-term control. She will get blood work today and continue IT sales professional. She will follow-up in 6 months.      Relevant Orders   T-helper cell (CD4)- ([facility] only)   HIV 1 RNA quant-no reflex-bld   CBC   Comprehensive metabolic panel   RPR   Lipid panel     Medium   CIGARETTE SMOKER    I encouraged her to consider cigarette cessation again.      Depression    Her depression is in remission.           Cliffton Asters, MD [facility] for Infectious Disease [facility] [phone] pager   [phone] cell 03/26/2017, 9:11 AM

## 2017-03-27 LAB — T-HELPER CELL (CD4) - (RCID CLINIC ONLY)
CD4 T CELL HELPER: 29 % — AB (ref 33–55)
CD4 T Cell Abs: 720 /uL (ref 400–2700)

## 2017-03-27 LAB — RPR

## 2017-04-01 LAB — HIV-1 RNA QUANT-NO REFLEX-BLD
HIV 1 RNA Quant: 24 copies/mL — ABNORMAL HIGH
HIV-1 RNA QUANT, LOG: 1.38 {Log_copies}/mL — AB

## 2017-04-06 ENCOUNTER — Other Ambulatory Visit: Payer: Self-pay | Admitting: Internal Medicine

## 2017-04-06 DIAGNOSIS — B20 Human immunodeficiency virus [HIV] disease: Secondary | ICD-10-CM

## 2017-05-12 ENCOUNTER — Telehealth: Payer: Self-pay | Admitting: *Deleted

## 2017-05-12 NOTE — Telephone Encounter (Signed)
Patient called requesting a new Rx for Boost that states the end date. Explained it was sent January 2018 with 11 refills; good through January 2019. I will resend the Rx to [facility]. Wendall MolaJacqueline Cockerham

## 2017-06-01 ENCOUNTER — Telehealth: Payer: Self-pay

## 2017-06-01 DIAGNOSIS — B20 Human immunodeficiency virus [HIV] disease: Secondary | ICD-10-CM

## 2017-06-01 MED ORDER — NUTRITIONAL SUPPLEMENT PO LIQD: 1.0000 | Freq: Two times a day (BID) | ORAL | 11 refills | Status: DC

## 2017-06-01 NOTE — Telephone Encounter (Signed)
Patient calling for a copy of boost script given at June visit . Patient states the one she was given at visit did not have a date.  Her case manager at [employer] needs this to fill the order.   I will print the script and fax it to [facility] as requested by the patient.    [phone] fax

## 2017-06-09 ENCOUNTER — Telehealth: Payer: Self-pay | Admitting: *Deleted

## 2017-06-09 NOTE — Telephone Encounter (Signed)
Kara Munoz with [facility] called to advise they can not give the patient Ensure as [place] in [place]. Advised will make a note.

## 2017-09-29 ENCOUNTER — Ambulatory Visit (INDEPENDENT_AMBULATORY_CARE_PROVIDER_SITE_OTHER): Payer: Medicare Other | Admitting: Internal Medicine

## 2017-09-29 ENCOUNTER — Encounter: Payer: Self-pay | Admitting: Internal Medicine

## 2017-09-29 DIAGNOSIS — B20 Human immunodeficiency virus [HIV] disease: Secondary | ICD-10-CM

## 2017-09-29 LAB — BASIC METABOLIC PANEL
BUN/Creatinine Ratio: 13 (calc) (ref 6–22)
BUN: 13 mg/dL (ref 7–25)
CALCIUM: 10.5 mg/dL — AB (ref 8.6–10.4)
CHLORIDE: 108 mmol/L (ref 98–110)
CO2: 30 mmol/L (ref 20–32)
Creat: 1.03 mg/dL — ABNORMAL HIGH (ref 0.50–0.99)
GLUCOSE: 64 mg/dL — AB (ref 65–99)
Potassium: 4 mmol/L (ref 3.5–5.3)
SODIUM: 142 mmol/L (ref 135–146)

## 2017-09-29 NOTE — Assessment & Plan Note (Signed)
Her infection remains under excellent, long-term control. She will get lab work today.She will continue her current regimen and follow-up in 6 months.

## 2017-09-29 NOTE — Progress Notes (Signed)
Patient Active Problem List   Diagnosis Date Noted  . Human immunodeficiency virus (HIV) disease (HCC) 07/23/2006    Priority: High  . DYSLIPIDEMIA 02/19/2010    Priority: Medium  . HYPERTENSION NEC 12/25/2009    Priority: Medium  . CIGARETTE SMOKER 05/09/2008    Priority: Medium  . ANXIETY 07/23/2006    Priority: Medium  . Polysubstance abuse (HCC) 07/23/2006    Priority: Medium  . Depression 07/23/2006    Priority: Medium  . DYSPLASIA, CERVIX NOS 07/23/2006    Priority: Medium  . GLAUCOMA 05/09/2008  . APPENDECTOMY, HX OF 12/02/2006  . GENITAL HERPES 07/23/2006  . 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 WARTS, OTHER SPECIFIED VIRAL 07/23/2006  . SYPHILIS 07/23/2006  . ANEMIA-NOS 07/23/2006  . PNEUMONIA, RECURRENT 07/23/2006  . DENTAL CARIES 07/23/2006      Medication List        Accurate as of 09/29/17  9:46 AM. Always use your most recent med list.          acyclovir cream 5 % Commonly known as:  ZOVIRAX   bimatoprost 0.01 % Soln Commonly known as:  LUMIGAN   Biotin 1 MG Caps   cetirizine 10 MG tablet Commonly known as:  ZYRTEC   docusate 50 MG/5ML liquid Commonly known as:  COLACE   econazole nitrate 1 % cream   EVOTAZ 300-150 MG tablet Generic drug:  atazanavir-cobicistat ONE TABLET BY MOUTH EVERY DAY. SWALLOW WHOLE. DO Not crush, cut or chew tablet. take with food   fish oil-omega-3 fatty acids 1000 MG capsule   mometasone 50 MCG/ACT nasal spray Commonly known as:  NASONEX   naproxen 375 MG tablet Commonly known as:  NAPROSYN   NUTRITIONAL SUPPLEMENT Liqd Take 1 Can by mouth 2 (two) times daily. Boost supplement, chocolate flavor   Oyster Shell Calcium/D 250-125 MG-UNIT Tabs   PAZEO 0.7 % Soln Generic drug:  Olopatadine HCl   pravastatin 40 MG tablet Commonly known as:  PRAVACHOL   PROAIR HFA 108 (90 Base) MCG/ACT inhaler Generic drug:  albuterol   pseudoephedrine 30 MG tablet Commonly known as:  SUDAFED   silver  sulfADIAZINE 1 % cream Commonly known as:  SILVADENE   THERA Tabs   triamcinolone cream 0.1 % Commonly known as:  KENALOG   TRIUMEQ 600-50-300 MG tablet Generic drug:  abacavir-dolutegravir-lamiVUDine TAKE 1 TABLET BY MOUTH EVERY DAY   valACYclovir 1000 MG tablet Commonly known as:  VALTREX Take 1/2 tablet (500 mg total) by mouth 2 (two) times daily. for 5 days as needed.   vitamin E 200 UNIT capsule   Vitamin-B Complex Tabs       Subjective: Kara Munoz is in for her routine HIV follow-up visit. She is doing very well. She has not had any problems obtaining, taking or tolerating her Triumeq or Evotaz. She takes them each evening at 11 PM and has not missed any doses. She is staying busy with her church and taking care of her 63 year old and and her great grandchildren. She is not feeling depressed.   Review of Systems: Review of Systems  Constitutional: Negative for chills, diaphoresis, fever, malaise/fatigue and weight loss.  HENT: Negative for sore throat.   Respiratory: Negative for cough, sputum production and shortness of breath.   Cardiovascular: Negative for chest pain.  Gastrointestinal: Negative for abdominal pain, diarrhea, heartburn, nausea and vomiting.  Genitourinary: Negative for dysuria and frequency.  Musculoskeletal: Positive for joint pain. Negative for myalgias.  She recently tripped coming out of church and fractured her right ankle. She has been in a boot ever since and is healing slowly.  Skin: Negative for rash.  Neurological: Negative for dizziness and headaches.  Psychiatric/Behavioral: Negative for depression and substance abuse. The patient is not nervous/anxious.     Past Medical History:  Diagnosis Date  . Depression   . HIV infection (HCC)   . Hyperlipidemia     Social History   Tobacco Use  . Smoking status: Current Every Day Smoker    Packs/day: 0.10    Years: 35.00    Pack years: 3.50    Types: Cigarettes  . Smokeless tobacco:  Never Used  Substance Use Topics  . Alcohol use: No    Alcohol/week: 0.0 oz  . Drug use: No    Comment: recovering, no now    No family history on file.  Allergies  Allergen Reactions  . Amoxicillin-Pot Clavulanate Diarrhea    Severe - Pt request it be listed as an allergy    Health Maintenance  Topic Date Due  . TETANUS/TDAP  03/19/1973  . MAMMOGRAM  03/19/2004  . COLONOSCOPY  03/19/2004  . PAP SMEAR  06/28/2012  . INFLUENZA VACCINE  05/13/2017  . Hepatitis C Screening  Completed  . HIV Screening  Completed    Objective:  Vitals:   09/29/17 0930  BP: 136/81  Pulse: 71  Temp: 97.6 F (36.4 C)  TempSrc: Oral  Weight: 134 lb (60.8 kg)  Height: 5\' 6"  (1.676 m)   Body mass index is 21.63 kg/m.  Physical Exam  Constitutional: She is oriented to person, place, and time.  HENT:  Mouth/Throat: No oropharyngeal exudate.  Eyes: Conjunctivae are normal.  Cardiovascular: Normal rate and regular rhythm.  No murmur heard. Pulmonary/Chest: Breath sounds normal.  Abdominal: Soft. She exhibits no mass. There is no tenderness.  Musculoskeletal:  She has a boot on her right lower leg and foot.  Neurological: She is alert and oriented to person, place, and time.  Skin: No rash noted.  Psychiatric: Mood and affect normal.    Lab Results Lab Results  Component Value Date   WBC 6.2 03/26/2017   HGB 13.1 03/26/2017   HCT 41.4 03/26/2017   MCV 92.2 03/26/2017   PLT 235 03/26/2017    Lab Results  Component Value Date   CREATININE 1.23 (H) 03/26/2017   BUN 17 03/26/2017   NA 141 03/26/2017   K 4.1 03/26/2017   CL 108 03/26/2017   CO2 25 03/26/2017    Lab Results  Component Value Date   ALT 23 03/26/2017   AST 18 03/26/2017   ALKPHOS 84 03/26/2017   BILITOT 2.4 (H) 03/26/2017    Lab Results  Component Value Date   CHOL 189 03/26/2017   HDL 63 03/26/2017   LDLCALC 99 03/26/2017   TRIG 135 03/26/2017   CHOLHDL 3.0 03/26/2017   Lab Results  Component Value  Date   LABRPR NON REAC 03/26/2017   HIV 1 RNA Quant (copies/mL)  Date Value  03/26/2017 24 (H)  09/23/2016 <20  03/18/2016 <20   CD4 T Cell Abs (/uL)  Date Value  03/26/2017 720  09/23/2016 780  03/18/2016 880     Problem List Items Addressed This Visit      High   Human immunodeficiency virus (HIV) disease (HCC)    Her infection remains under excellent, long-term control. She will get lab work today.She will continue her current regimen and follow-up in  6 months.      Relevant Orders   T-helper cell (CD4)- ([facility] clinic only)   HIV 1 RNA quant-no reflex-bld   CBC   Comprehensive metabolic panel   Lipid panel   RPR        Cliffton AstersJohn Ryker [place], MD [facility] [phone] pager   [phone] cell 09/29/2017, 9:46 AM

## 2017-09-30 LAB — T-HELPER CELL (CD4) - (RCID CLINIC ONLY)
CD4 % Helper T Cell: 26 % — ABNORMAL LOW (ref 33–55)
CD4 T CELL ABS: 650 /uL (ref 400–2700)

## 2017-10-01 LAB — HIV-1 RNA QUANT-NO REFLEX-BLD
HIV 1 RNA Quant: 71 copies/mL — ABNORMAL HIGH
HIV-1 RNA QUANT, LOG: 1.85 {Log_copies}/mL — AB

## 2017-10-14 ENCOUNTER — Telehealth: Payer: Self-pay | Admitting: *Deleted

## 2017-10-14 NOTE — Telephone Encounter (Signed)
Labs faxed to patient's case manager per patient' request.  Adele DanAlicia Diggs, [facility].  P: [phone]: [phone], f: [phone]: [phone]. Andree CossHowell, Conda Wannamaker M, RN

## 2017-10-19 ENCOUNTER — Other Ambulatory Visit: Payer: Self-pay | Admitting: Internal Medicine

## 2017-10-19 DIAGNOSIS — B20 Human immunodeficiency virus [HIV] disease: Secondary | ICD-10-CM

## 2017-10-19 MED ORDER — ABACAVIR-DOLUTEGRAVIR-LAMIVUD 600-50-300 MG PO TABS: 1.0000 | ORAL_TABLET | Freq: Every day | ORAL | 5 refills | Status: DC

## 2018-03-30 ENCOUNTER — Ambulatory Visit (INDEPENDENT_AMBULATORY_CARE_PROVIDER_SITE_OTHER): Payer: Medicare Other | Admitting: Licensed Clinical Social Worker

## 2018-03-30 ENCOUNTER — Ambulatory Visit (INDEPENDENT_AMBULATORY_CARE_PROVIDER_SITE_OTHER): Payer: Medicare Other | Admitting: Internal Medicine

## 2018-03-30 ENCOUNTER — Encounter: Payer: Self-pay | Admitting: Internal Medicine

## 2018-03-30 DIAGNOSIS — F17200 Nicotine dependence, unspecified, uncomplicated: Secondary | ICD-10-CM | POA: Diagnosis not present

## 2018-03-30 DIAGNOSIS — F330 Major depressive disorder, recurrent, mild: Secondary | ICD-10-CM

## 2018-03-30 DIAGNOSIS — B20 Human immunodeficiency virus [HIV] disease: Secondary | ICD-10-CM

## 2018-03-30 NOTE — Progress Notes (Signed)
Patient Active Problem List   Diagnosis Date Noted  . Human immunodeficiency virus (HIV) disease (HCC) 07/23/2006    Priority: High  . DYSLIPIDEMIA 02/19/2010    Priority: Medium  . HYPERTENSION NEC 12/25/2009    Priority: Medium  . CIGARETTE SMOKER 05/09/2008    Priority: Medium  . ANXIETY 07/23/2006    Priority: Medium  . Polysubstance abuse (HCC) 07/23/2006    Priority: Medium  . Depression 07/23/2006    Priority: Medium  . DYSPLASIA, CERVIX NOS 07/23/2006    Priority: Medium  . GLAUCOMA 05/09/2008  . APPENDECTOMY, HX OF 12/02/2006  . GENITAL HERPES 07/23/2006  . 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 WARTS, OTHER SPECIFIED VIRAL 07/23/2006  . SYPHILIS 07/23/2006  . ANEMIA-NOS 07/23/2006  . PNEUMONIA, RECURRENT 07/23/2006  . DENTAL CARIES 07/23/2006    Patient's Medications  New Prescriptions   No medications on file  Previous Medications   ABACAVIR-DOLUTEGRAVIR-LAMIVUDINE (TRIUMEQ) 600-50-300 MG TABLET    Take 1 tablet by mouth daily.   ACYCLOVIR CREAM (ZOVIRAX) 5 %    Apply topically.   ALBUTEROL (PROAIR HFA) 108 (90 BASE) MCG/ACT INHALER    Inhale into the lungs.   B COMPLEX VITAMINS (VITAMIN-B COMPLEX) TABS    Take by mouth.   BIMATOPROST (LUMIGAN) 0.01 % SOLN    Place 1 drop into both eyes at bedtime.   BIOTIN 1 MG CAPS    Take by mouth.   CALCIUM CARBONATE-VITAMIN D (OYSTER SHELL CALCIUM/D) 250-125 MG-UNIT TABS    Take 1 tablet by mouth.   CETIRIZINE (ZYRTEC) 10 MG TABLET    Take by mouth.   DOCUSATE (COLACE) 50 MG/5ML LIQUID    Take by mouth.   ECONAZOLE NITRATE 1 % CREAM    Apply topically.   EVOTAZ 300-150 MG TABLET    ONE TABLET BY MOUTH EVERY DAY. SWALLOW WHOLE. DO Not crush, cut or chew tablet. take with food   FISH OIL-OMEGA-3 FATTY ACIDS 1000 MG CAPSULE    Take 1 g by mouth daily.   MOMETASONE (NASONEX) 50 MCG/ACT NASAL SPRAY       MULTIPLE VITAMIN (THERA) TABS    Take 1 tablet by mouth.   NAPROXEN (NAPROSYN) 375 MG TABLET    TAKE ONE  TABLET BY MOUTH TWICE DAILY with meals   NEOMYCIN-POLYMYXIN-DEXAMETHASONE (MAXITROL) 0.1 % OPHTHALMIC SUSPENSION    1 drop 4 (four) times daily.   NUTRITIONAL SUPPLEMENT LIQD    Take 1 Can by mouth 2 (two) times daily. Boost supplement, chocolate flavor   PAZEO 0.7 % SOLN    INSTILL 1 DROP IN BOTH EYES DAILY IN THE MORNING.   PRAVASTATIN (PRAVACHOL) 40 MG TABLET       PSEUDOEPHEDRINE (SUDAFED) 30 MG TABLET    Take 30 mg by mouth.   SILVER SULFADIAZINE (SILVADENE) 1 % CREAM    Apply topically 2 (two) times daily.   TRIAMCINOLONE CREAM (KENALOG) 0.1 %    apply TWO TIMES DAILY   VALACYCLOVIR (VALTREX) 1000 MG TABLET    Take 1/2 tablet (500 mg total) by mouth 2 (two) times daily. for 5 days as needed.   VITAMIN E 200 UNIT CAPSULE    Take by mouth.  Modified Medications   No medications on file  Discontinued Medications   No medications on file    Subjective: Kara Munoz is in for her routine HIV follow-up visit.  She has had no problems obtaining, taking or tolerating her Triumeq and even it  has.  She denies missing any doses.  She says that her mood has been up and down recently.  She says that when she does become depressed she tries sit down and read a book.  She said she started back smoking cigarettes.  She recently stepped on a rock and broke her right ankle.  She was in a soft boot for a while and is doing much better.  She underwent recent hemorrhoid surgery but is still bothered by one hemorrhoid.  She was treated last month for pinkeye in her left eye.  Review of Systems: Review of Systems  Constitutional: Negative for chills, diaphoresis, fever, malaise/fatigue and weight loss.  HENT: Negative for congestion and sore throat.   Eyes:       As noted in HPI.  Respiratory: Negative for cough, sputum production and shortness of breath.   Cardiovascular: Negative for chest pain.  Gastrointestinal: Negative for abdominal pain, diarrhea, heartburn, nausea and vomiting.       Hemorrhoids.    Genitourinary: Negative for dysuria and frequency.  Musculoskeletal: Negative for joint pain and myalgias.  Skin: Negative for rash.  Neurological: Negative for dizziness and headaches.  Psychiatric/Behavioral: Positive for depression. Negative for substance abuse. The patient is not nervous/anxious.     Past Medical History:  Diagnosis Date  . Depression   . HIV infection (HCC)   . Hyperlipidemia     Social History   Tobacco Use  . Smoking status: Current Every Day Smoker    Packs/day: 0.10    Years: 35.00    Pack years: 3.50    Types: Cigarettes  . Smokeless tobacco: Never Used  Substance Use Topics  . Alcohol use: No    Alcohol/week: 0.0 oz  . Drug use: No    Comment: recovering, no now    No family history on file.  Allergies  Allergen Reactions  . Amoxicillin-Pot Clavulanate Diarrhea    Severe - Pt request it be listed as an allergy    Health Maintenance  Topic Date Due  . TETANUS/TDAP  03/19/1973  . MAMMOGRAM  03/19/2004  . COLONOSCOPY  03/19/2004  . PAP SMEAR  06/28/2012  . INFLUENZA VACCINE  05/13/2018  . Hepatitis C Screening  Completed  . HIV Screening  Completed    Objective:  Vitals:   03/30/18 0903  BP: 127/79  Pulse: 89  Temp: 97.6 F (36.4 C)  TempSrc: Oral  Weight: 126 lb (57.2 kg)   Body mass index is 20.34 kg/m.  Physical Exam  Constitutional: She is oriented to person, place, and time.  She is in good spirits.  Her weight is down 4 pounds.  HENT:  Mouth/Throat: No oropharyngeal exudate.  Eyes: Conjunctivae are normal.  Cardiovascular: Normal rate, regular rhythm and normal heart sounds.  No murmur heard. Pulmonary/Chest: Effort normal and breath sounds normal.  Abdominal: Soft. She exhibits no mass. There is no tenderness.  Musculoskeletal: Normal range of motion.  Neurological: She is alert and oriented to person, place, and time.  Skin: No rash noted.  Psychiatric: She has a normal mood and affect.    Lab  Results Lab Results  Component Value Date   WBC 6.2 03/26/2017   HGB 13.1 03/26/2017   HCT 41.4 03/26/2017   MCV 92.2 03/26/2017   PLT 235 03/26/2017    Lab Results  Component Value Date   CREATININE 1.03 (H) 09/29/2017   BUN 13 09/29/2017   NA 142 09/29/2017   K 4.0 09/29/2017   CL  108 09/29/2017   CO2 30 09/29/2017    Lab Results  Component Value Date   ALT 23 03/26/2017   AST 18 03/26/2017   ALKPHOS 84 03/26/2017   BILITOT 2.4 (H) 03/26/2017    Lab Results  Component Value Date   CHOL 189 03/26/2017   HDL 63 03/26/2017   LDLCALC 99 03/26/2017   TRIG 135 03/26/2017   CHOLHDL 3.0 03/26/2017   Lab Results  Component Value Date   LABRPR NON REAC 03/26/2017   HIV 1 RNA Quant (copies/mL)  Date Value  09/29/2017 71 (H)  03/26/2017 24 (H)  09/23/2016 <20   CD4 T Cell Abs (/uL)  Date Value  09/29/2017 650  03/26/2017 720  09/23/2016 780     Problem List Items Addressed This Visit      High   Human immunodeficiency virus (HIV) disease (HCC)    Her adherence remains excellent.  She will continue her current antiretroviral regimen, get lab work today and follow-up in 6 months.      Relevant Orders   T-helper cell (CD4)- ([facility] clinic only)   HIV 1 RNA quant-no reflex-bld   Comprehensive metabolic panel   RPR   Lipid panel   CBC     Medium   CIGARETTE SMOKER    I encouraged her to quit again.      Depression    She continues to struggle with chronic depression.  Had her meet with our behavioral health counselor, Angus Palms, today.           Cliffton Asters, MD [facility] [phone] pager   [phone] cell 03/30/2018, 9:23 AM

## 2018-03-30 NOTE — Assessment & Plan Note (Signed)
She continues to struggle with chronic depression.  Had her meet with our behavioral health counselor, Angus PalmsRegina Alexander, today.

## 2018-03-30 NOTE — Assessment & Plan Note (Signed)
I encouraged her to quit again. 

## 2018-03-30 NOTE — BH Specialist Note (Addendum)
  Integrated Behavioral Health Initial Visit  MRN: [id number] Name: Kara Munoz  Number of Integrated Behavioral Health Clinician visits:: 1/6 Session Start time: 9:15am  Session End time: 9:32am Total time: 15 minutes  Type of Service: Integrated Behavioral Health- Individual/Family Interpretor:No. Interpretor Name and Language: n/a   Warm Hand Off Completed.       SUBJECTIVE: Kara MiloSandra Champine is a 62 y.o. female accompanied by self Patient was referred by Dr. Orvan Falconerampbell for depressive symptoms. Patient reports the following symptoms/concerns: low energy, trouble getting up/has to force herself to do things, gets "in [her] head" and starts to feel very sad, losing weight Duration of problem: ongoing; Severity of problem: moderate  OBJECTIVE: Mood: Depressed and Affect: Blunt Risk of harm to self or others: No plan to harm self or others  LIFE CONTEXT: Patient has friends and family she spends a great deal of time with. She describes spending time at the park with her great-grandchildren, and making plans for trips with her best friend, a newer friend, and her daughter. Patient is well connected to resources and has a case manager who provides transportation for her. She has positive activities she engages in, though she reports that sometimes she has to force herself to do them because she would rather just remain in bed. Patient has some concerns about her medical condition, though she is undetectable, because of weight loss.   GOALS ADDRESSED: Patient will: 1. Reduce symptoms of: depression  INTERVENTIONS: Interventions utilized: Motivational Interviewing and Supportive Counseling   ASSESSMENT: Patient currently experiencing depressed mood (at times), low motivation, lack of energy, unintended weight loss,  tendency/urge to isolate, anxiety. 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 Counselor will continue to  assess whether this is minimization and may be more severe than it presents today. Patient has a history of depression, and reports that in the past she has gotten to the point of being suicidal. She states that she used to deal with it in the past by using drugs, and now that she is no longer doing that the depressed mood still comes back sometimes. Counselor normalized this, pointed out that it is okay to feel down sometimes, and commended patient for forcing herself to do the things she needs to do. Patient indicated that she finds it very bothersome that she has to use so much energy to get herself to do the things she wants to do or knows she should do. Counselor and patient explored ways to celebrate things she gets done rather than focusing on the amount of energy it took for her to do them.    Patient may benefit from ongoing CBT to address depressive symptoms.  PLAN: 1. Follow up with behavioral health clinician on : 04/06/18 @ 9:30am   Angus Palmsegina Alexander, LCSW

## 2018-03-30 NOTE — Assessment & Plan Note (Signed)
Her adherence remains excellent.  She will continue her current antiretroviral regimen, get lab work today and follow-up in 6 months.

## 2018-03-31 LAB — COMPREHENSIVE METABOLIC PANEL
AG RATIO: 1.6 (calc) (ref 1.0–2.5)
ALKALINE PHOSPHATASE (APISO): 90 U/L (ref 33–130)
ALT: 21 U/L (ref 6–29)
AST: 19 U/L (ref 10–35)
Albumin: 4.4 g/dL (ref 3.6–5.1)
BUN / CREAT RATIO: 15 (calc) (ref 6–22)
BUN: 18 mg/dL (ref 7–25)
CHLORIDE: 105 mmol/L (ref 98–110)
CO2: 29 mmol/L (ref 20–32)
CREATININE: 1.22 mg/dL — AB (ref 0.50–0.99)
Calcium: 11.2 mg/dL — ABNORMAL HIGH (ref 8.6–10.4)
GLOBULIN: 2.8 g/dL (ref 1.9–3.7)
GLUCOSE: 76 mg/dL (ref 65–99)
POTASSIUM: 3.9 mmol/L (ref 3.5–5.3)
Sodium: 142 mmol/L (ref 135–146)
Total Bilirubin: 3.3 mg/dL — ABNORMAL HIGH (ref 0.2–1.2)
Total Protein: 7.2 g/dL (ref 6.1–8.1)

## 2018-03-31 LAB — T-HELPER CELL (CD4) - (RCID CLINIC ONLY)
CD4 T CELL ABS: 860 /uL (ref 400–2700)
CD4 T CELL HELPER: 29 % — AB (ref 33–55)

## 2018-03-31 LAB — CBC
HCT: 44.4 % (ref 35.0–45.0)
Hemoglobin: 14.9 g/dL (ref 11.7–15.5)
MCH: 29.7 pg (ref 27.0–33.0)
MCHC: 33.6 g/dL (ref 32.0–36.0)
MCV: 88.4 fL (ref 80.0–100.0)
MPV: 11.5 fL (ref 7.5–12.5)
PLATELETS: 220 10*3/uL (ref 140–400)
RBC: 5.02 10*6/uL (ref 3.80–5.10)
RDW: 14.3 % (ref 11.0–15.0)
WBC: 7.4 10*3/uL (ref 3.8–10.8)

## 2018-03-31 LAB — RPR: RPR Ser Ql: NONREACTIVE

## 2018-03-31 LAB — LIPID PANEL
CHOL/HDL RATIO: 3.5 (calc) (ref <5.0)
Cholesterol: 236 mg/dL — ABNORMAL HIGH (ref <200)
HDL: 67 mg/dL (ref >50)
LDL Cholesterol (Calc): 132 mg/dL (calc) — ABNORMAL HIGH
NON-HDL CHOLESTEROL (CALC): 169 mg/dL — AB (ref <130)
Triglycerides: 228 mg/dL — ABNORMAL HIGH (ref <150)

## 2018-04-01 LAB — HIV-1 RNA QUANT-NO REFLEX-BLD
HIV 1 RNA Quant: 967 copies/mL — ABNORMAL HIGH
HIV-1 RNA Quant, Log: 2.99 Log copies/mL — ABNORMAL HIGH

## 2018-04-06 ENCOUNTER — Ambulatory Visit (INDEPENDENT_AMBULATORY_CARE_PROVIDER_SITE_OTHER): Payer: Medicare Other | Admitting: Licensed Clinical Social Worker

## 2018-04-06 DIAGNOSIS — F330 Major depressive disorder, recurrent, mild: Secondary | ICD-10-CM | POA: Diagnosis not present

## 2018-04-06 NOTE — BH Specialist Note (Signed)
Integrated Behavioral Health Follow Up Visit  MRN: [id number] Name: Kara Munoz  Number of Integrated Behavioral Health Clinician visits: 2/6 Session Start time: 9:20am  Session End time: 9:53am Total time: 30 minutes  Type of Service: Integrated Behavioral Health- Individual/Family Interpretor:No. Interpretor Name and Language: n/a  SUBJECTIVE: Kara Munoz is a 62 y.o. female accompanied by self Patient reports the following symptoms/concerns: gets angry easily, lack of motivation and energy, feeling unhappy all the time   OBJECTIVE: Mood: Irritable and Affect: Constricted Risk of harm to self or others: No plan to harm self or others  LIFE CONTEXT: Patient presents as frustrated and irritable. She describes a problem with transportation this morning as the reason for her frustration, but reports that she has been getting angry very easily lately. Patient states that two of her best friends are now in new relationships and she does not see as much of them, and her partner of 23 years has no interest in doing anything but sitting home and watching TV. She also indicates that her relationship with her daughter deteriorates easily because daughter constantly brings up patient's history of drug use when daughter was a child and gets very angry about it, and patient has to ask her to leave when she does this.Spending time with her great-grandchildren is a highlight for her, and reading because she "travels the world" through books.   GOALS ADDRESSED: Patient will: 1.  Reduce symptoms of: depression   INTERVENTIONS: Interventions utilized:  Solution-Focused Strategies, Brief CBT and Supportive Counseling  ASSESSMENT: Patient currently experiencing irritability, lack of energy and motivation, depressed mood, crying spells. She states that she believes the root of her unhappiness is the fear that her "time is running out" since she's been HIV+ since 1992. Patient states that she watched  all her friends die and is now at the senior member at [employer]. Counselor explored these fears with patient. Patient acknowledged that her viral load is undetectable and she is doing very well, but stated that she still has these worries, especially when she has any medical problems. Counselor and patient explored other areas of patient's life that may impact her wellbeing. Patient reports that she has been in a relationship for 23 years, but it feels more like a roommate situation now. She and partner have not been intimate in 4 years, and this occupies her thoughts a lot, making her feel unlovable. Counselor guided patient in discussing this situation, and the communication she has had with partner on the topic. Counselor pointed out that these conversations seem to support that the problem has to do with partner's health or lack of interest in sex, not with patient at all. Patient was able to acknowledge this, but states that she still has the thoughts about being unlovable, "you know how women do." Counselor normalized this feeling, but emphasized to patient that she does not have to let his choices negatively impact her self-concept. Counselor educated patient on dimensions of wellness and discussed with her ways to improve various dimensions. Patient was provided with a copy of Dimensions of Wellness Wheel and agreed to check in with herself about the various dimensions when she has that "down" feeling and does not know why.   Patient may benefit from monthly cognitive behavioral therapy.  PLAN: 1. Return to see behavioral health clinician on 05/03/18 at 9:30am.  Angus Palmsegina Bryant Saye, LCSW

## 2018-04-13 ENCOUNTER — Telehealth: Payer: Self-pay | Admitting: Pharmacist

## 2018-04-13 NOTE — Telephone Encounter (Signed)
Patient recently saw Dr. Orvan Falconerampbell and her HIV viral load was checked and came back high at 967. It has been creeping up slowly over the last year (<20, 24, 71, 967).  Called patient today to discuss and she states she has not missed any doses of her Triumeq or Evotaz in "a long time" and she takes it every night at 11pm.  When I told her that her viral load was up, she got very concerned and kept asking if she had AIDS.  I kept telling her that her CD4 count was normal but that it was her viral load that was elevated right now.  Will bring her back in next week for repeat labs with resistance and possible medication change.  I cannot find any previous resistance labs. She was previously on Kaletra + Truvada, then switched to Reyataz/Norvir + Truvada due to elevated lipids, then was switched to Triumeq + Evotaz back in November 2015 due to renal insufficiency. From her recent SCr, her CrCl is ~42 ml/min.  [facility] probably [facility] Symtuza for simplification and high barrier to resistance.  Will see patient next Wednesday.

## 2018-04-15 NOTE — Telephone Encounter (Signed)
I agree with this plan.

## 2018-04-21 ENCOUNTER — Ambulatory Visit (INDEPENDENT_AMBULATORY_CARE_PROVIDER_SITE_OTHER): Payer: Medicare Other | Admitting: Pharmacist

## 2018-04-21 DIAGNOSIS — B20 Human immunodeficiency virus [HIV] disease: Secondary | ICD-10-CM | POA: Diagnosis not present

## 2018-04-21 DIAGNOSIS — Z7189 Other specified counseling: Secondary | ICD-10-CM

## 2018-04-21 LAB — COMPREHENSIVE METABOLIC PANEL
AG RATIO: 1.7 (calc) (ref 1.0–2.5)
ALBUMIN MSPROF: 4.1 g/dL (ref 3.6–5.1)
ALKALINE PHOSPHATASE (APISO): 102 U/L (ref 33–130)
ALT: 16 U/L (ref 6–29)
AST: 19 U/L (ref 10–35)
BILIRUBIN TOTAL: 2.7 mg/dL — AB (ref 0.2–1.2)
BUN/Creatinine Ratio: 13 (calc) (ref 6–22)
BUN: 16 mg/dL (ref 7–25)
CALCIUM: 10.6 mg/dL — AB (ref 8.6–10.4)
CO2: 28 mmol/L (ref 20–32)
Chloride: 106 mmol/L (ref 98–110)
Creat: 1.28 mg/dL — ABNORMAL HIGH (ref 0.50–0.99)
Globulin: 2.4 g/dL (calc) (ref 1.9–3.7)
Glucose, Bld: 79 mg/dL (ref 65–99)
POTASSIUM: 4.4 mmol/L (ref 3.5–5.3)
Sodium: 140 mmol/L (ref 135–146)
TOTAL PROTEIN: 6.5 g/dL (ref 6.1–8.1)

## 2018-04-21 MED ORDER — DARUN-COBIC-EMTRICIT-TENOFAF 800-150-200-10 MG PO TABS: 1.0000 | ORAL_TABLET | Freq: Every day | ORAL | 5 refills | Status: DC

## 2018-04-21 NOTE — Progress Notes (Signed)
HPI: Kara Munoz is a 62 y.o. female who presents to the [facility] clinic for HIV adherence follow-up.  Patient Active Problem List   Diagnosis Date Noted  . DYSLIPIDEMIA 02/19/2010  . HYPERTENSION NEC 12/25/2009  . CIGARETTE SMOKER 05/09/2008  . GLAUCOMA 05/09/2008  . APPENDECTOMY, HX OF 12/02/2006  . Human immunodeficiency virus (HIV) disease (HCC) 07/23/2006  . GENITAL HERPES 07/23/2006  . 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 WARTS, OTHER SPECIFIED VIRAL 07/23/2006  . SYPHILIS 07/23/2006  . ANEMIA-NOS 07/23/2006  . ANXIETY 07/23/2006  . Polysubstance abuse (HCC) 07/23/2006  . Depression 07/23/2006  . PNEUMONIA, RECURRENT 07/23/2006  . DENTAL CARIES 07/23/2006  . DYSPLASIA, CERVIX NOS 07/23/2006    Patient's Medications  New Prescriptions   DARUNAVIR-COBICISCTAT-EMTRICITABINE-TENOFOVIR ALAFENAMIDE (SYMTUZA) 800-150-200-10 MG TABS    Take 1 tablet by mouth daily with breakfast.  Previous Medications   ACYCLOVIR CREAM (ZOVIRAX) 5 %    Apply topically.   ALBUTEROL (PROAIR HFA) 108 (90 BASE) MCG/ACT INHALER    Inhale into the lungs.   B COMPLEX VITAMINS (VITAMIN-B COMPLEX) TABS    Take by mouth.   BIMATOPROST (LUMIGAN) 0.01 % SOLN    Place 1 drop into both eyes at bedtime.   BIOTIN 1 MG CAPS    Take by mouth.   CALCIUM CARBONATE-VITAMIN D (OYSTER SHELL CALCIUM/D) 250-125 MG-UNIT TABS    Take 1 tablet by mouth.   CETIRIZINE (ZYRTEC) 10 MG TABLET    Take by mouth.   DOCUSATE (COLACE) 50 MG/5ML LIQUID    Take by mouth.   ECONAZOLE NITRATE 1 % CREAM    Apply topically.   FISH OIL-OMEGA-3 FATTY ACIDS 1000 MG CAPSULE    Take 1 g by mouth daily.   MOMETASONE (NASONEX) 50 MCG/ACT NASAL SPRAY       MULTIPLE VITAMIN (THERA) TABS    Take 1 tablet by mouth.   NAPROXEN (NAPROSYN) 375 MG TABLET    TAKE ONE TABLET BY MOUTH TWICE DAILY with meals   NEOMYCIN-POLYMYXIN-DEXAMETHASONE (MAXITROL) 0.1 % OPHTHALMIC SUSPENSION    1 drop 4 (four) times daily.   NORTRIPTYLINE (PAMELOR) 10  MG CAPSULE    Take 10 mg by mouth at bedtime.   NUTRITIONAL SUPPLEMENT LIQD    Take 1 Can by mouth 2 (two) times daily. Boost supplement, chocolate flavor   PAZEO 0.7 % SOLN    INSTILL 1 DROP IN BOTH EYES DAILY IN THE MORNING.   PRAVASTATIN (PRAVACHOL) 40 MG TABLET       PSEUDOEPHEDRINE (SUDAFED) 30 MG TABLET    Take 30 mg by mouth.   SILVER SULFADIAZINE (SILVADENE) 1 % CREAM    Apply topically 2 (two) times daily.   TRIAMCINOLONE CREAM (KENALOG) 0.1 %    apply TWO TIMES DAILY   VALACYCLOVIR (VALTREX) 1000 MG TABLET    Take 1/2 tablet (500 mg total) by mouth 2 (two) times daily. for 5 days as needed.   VITAMIN E 200 UNIT CAPSULE    Take by mouth.  Modified Medications   No medications on file  Discontinued Medications   ABACAVIR-DOLUTEGRAVIR-LAMIVUDINE (TRIUMEQ) 600-50-300 MG TABLET    Take 1 tablet by mouth daily.   EVOTAZ 300-150 MG TABLET    ONE TABLET BY MOUTH EVERY DAY. SWALLOW WHOLE. DO Not crush, cut or chew tablet. take with food    Allergies: Allergies  Allergen Reactions  . Amoxicillin-Pot Clavulanate Diarrhea    Severe - Pt request it be listed as an allergy    Past Medical  History: Past Medical History:  Diagnosis Date  . Depression   . HIV infection (HCC)   . Hyperlipidemia     Social History: Social History   Socioeconomic History  . Marital status: Divorced    Spouse name: Not on file  . Number of children: Not on file  . Years of education: Not on file  . Highest education level: Not on file  Occupational History  . Not on file  Social Needs  . Financial resource strain: Not on file  . Food insecurity:    Worry: Not on file    Inability: Not on file  . Transportation needs:    Medical: Not on file    Non-medical: Not on file  Tobacco Use  . Smoking status: Current Every Day Smoker    Packs/day: 0.10    Years: 35.00    Pack years: 3.50    Types: Cigarettes  . Smokeless tobacco: Never Used  Substance and Sexual Activity  . Alcohol use: No     Alcohol/week: 0.0 oz  . Drug use: No    Comment: recovering, no now  . Sexual activity: Not Currently    Partners: Male    Comment: given condoms for grandchild  Lifestyle  . Physical activity:    Days per week: Not on file    Minutes per session: Not on file  . Stress: Not on file  Relationships  . Social connections:    Talks on phone: Not on file    Gets together: Not on file    Attends religious service: Not on file    Active member of club or organization: Not on file    Attends meetings of clubs or organizations: Not on file    Relationship status: Not on file  Other Topics Concern  . Not on file  Social History Narrative  . Not on file    Labs: Lab Results  Component Value Date   HIV1RNAQUANT 967 (H) 03/30/2018   HIV1RNAQUANT 71 (H) 09/29/2017   HIV1RNAQUANT 24 (H) 03/26/2017   CD4TABS 860 03/30/2018   CD4TABS 650 09/29/2017   CD4TABS 720 03/26/2017    RPR and STI Lab Results  Component Value Date   LABRPR NON-REACTIVE 03/30/2018   LABRPR NON REAC 03/26/2017   LABRPR NON REAC 03/18/2016   LABRPR NON REAC 03/20/2015   LABRPR NON REAC 08/01/2014    No flowsheet data found.  Hepatitis B Lab Results  Component Value Date   HEPBSAB No 12/07/2006   HEPBSAG No 12/07/2006   Hepatitis C No results found for: HEPCAB, HCVRNAPCRQN Hepatitis A No results found for: HAV Lipids: Lab Results  Component Value Date   CHOL 236 (H) 03/30/2018   TRIG 228 (H) 03/30/2018   HDL 67 03/30/2018   CHOLHDL 3.5 03/30/2018   VLDL 27 03/26/2017   LDLCALC 132 (H) 03/30/2018    Current HIV Regimen: Evotaz + Triumeq  Assessment: [name] is here today to follow-up with me for her HIV infection.  She recently saw Dr. Orvan Falconerampbell back in June where she had an elevated HIV viral load of 967. Her viral load has consistently been undetectable except for the past year where it has been creeping up steadily (<20, 24, 71, 967).  I had her come in to see me by request of Dr. Orvan Falconerampbell  to discuss her medications and adherence.  She was previously on Kaletra + Truvada but was switched to Reyataz/Norvir + Truvada for hyperlipidemia.  Most recently in 2015, she was switched  to Evotaz + Triumeq to avoid any tenofovir-containing regimens when she developed some renal disease and has been maintained on that regimen ever since.  She states she religiously takes her Evotaz + Triumeq every night at 11pm and has honestly not missed any doses in a very long time.    She is quite concerned about her viral load. She has been having trouble with her left eye and has been prescribed oral clindamycin and prednisolone eye drops. She also had some hemorrhoids removed back in May for which she thinks "messed up her system" and is the reason her viral load is detectable. I explained to her that it was unlikely due to the surgery and there were actually no interactions between her recent antibiotics and her ART.    Her most recent estimated CrCl is ~42 ml/min, so I will change her to Symtuza monotherapy today. I could not find any resistance testing done in the past, so I will add that to her labs today. I checked with her insurance and Darrell Jewel is covered with a $0 co-pay. I instructed her to make sure she takes Symtuza with food for full absorption at the same time each day.  She will either take it with dinner at 6pm or at night before bed with a snack depending on which one she decides to go with. I told her either was fine but to make sure it stays consistent. I also told her to call me in the future if she ever starts any new medications or has any problems. I will see her back in 5-6 weeks to recheck labs.  Plan: - Stop Evotaz and Triumeq - Start Symtuza PO once daily with food - F/u with me again 8/15 at 1045am  Kalyan Barabas L. Demosthenes Virnig, PharmD, AAHIVP, CPP Infectious Diseases Clinical Pharmacist [facility] 04/21/2018, 2:55 PM

## 2018-04-22 LAB — T-HELPER CELL (CD4) - (RCID CLINIC ONLY)
CD4 T CELL ABS: 500 /uL (ref 400–2700)
CD4 T CELL HELPER: 24 % — AB (ref 33–55)

## 2018-04-26 ENCOUNTER — Telehealth: Payer: Self-pay | Admitting: Pharmacist

## 2018-04-26 LAB — HIV RNA, RTPCR W/R GT (RTI, PI,INT)
HIV 1 RNA QUANT: 137 {copies}/mL — AB
HIV-1 RNA QUANT, LOG: 2.14 {Log_copies}/mL — AB

## 2018-04-26 NOTE — Telephone Encounter (Signed)
Called [name] to see how she was doing on the [place]tuza.  She states the first day it made her nauseous but it has been fine since then.  She takes it in the morning with breakfast.  I also told her that we rechecked her viral load and it was down to 137 - still not undetectable.  Told her to continue taking the meds and call me with any questions or issues. She will see me again on 8/15.

## 2018-04-27 ENCOUNTER — Other Ambulatory Visit: Payer: Self-pay | Admitting: Internal Medicine

## 2018-04-27 DIAGNOSIS — B20 Human immunodeficiency virus [HIV] disease: Secondary | ICD-10-CM

## 2018-05-03 ENCOUNTER — Ambulatory Visit (INDEPENDENT_AMBULATORY_CARE_PROVIDER_SITE_OTHER): Payer: Medicare Other | Admitting: Licensed Clinical Social Worker

## 2018-05-03 DIAGNOSIS — F331 Major depressive disorder, recurrent, moderate: Secondary | ICD-10-CM

## 2018-05-04 NOTE — BH Specialist Note (Signed)
Integrated Behavioral Health Follow Up Visit  MRN: [id number] Name: Kara Munoz  Number of Integrated Behavioral Health Clinician visits: 3/6 Session Start time:  9:02 am   Session End time: 9:33am Total time: 40 minutes  Type of Service: Integrated Behavioral Health- Individual/Family Interpretor:No. Interpretor Name and Language: n/a  SUBJECTIVE: Kara Munoz is a 62 y.o. female accompanied by self Patient reports the following symptoms/concerns: easily angered, does not want to leave the house, desire to isolate, worries about finances, cravings/desires to use, feelings of guilt/worthlessness, sadness, no joy in life  OBJECTIVE: Mood: Depressed and Affect: angry/irritable Risk of harm to self or others: No plan to harm self or others  LIFE CONTEXT: Patient reports she has had several financial and physical stressors lately. She states that "the demons are working on [her]" and trying to get her to go back out and use crack. Patient states she has no desire to use, but there are thoughts that come to mind that it would not really matter if she did, and if life is going to be this hard why not? She indicates that she does not talk to her boyfriend or friends about these feelings, as she does not like to "put [her] personal information out there".   GOALS ADDRESSED: Patient will: 1.  Reduce symptoms of: depression  2.  Demonstrate ability to: Resist cravings  INTERVENTIONS: Interventions utilized:  Supportive Counseling, Reality Therapy and Relapse Prevention Therapy  ASSESSMENT: Patient currently experiencing anhedonia, irritability, desire to isolate, anxiety, depressed mood, and excessive guilt. She shares about her desires to use, and expresses frustration in the fact that she does not really want or intend to use but cannot get past the thoughts. Counselor normalized this and commended patient on fighting back against cravings. Patient processed her history of crack cocaine  use, identifying that it was a self-destructive response to her HIV diagnosis. She stated multiple times how different her life would have been if she could have recovered the diagnosis differently. Patient reported that she "fell from grace" when she moved back from [place] and this combined with her crack use to make her life what it is now. Counselor used paradoxical interventions to help patient recognize that although her life is not what it used to be, she has survived quite a bit and rebuilt important portions of her life. Counselor guided patient in identifying the choices she has made that allowed her to do so and emphasized that she did not HAVE to do these things, she chose to. Counselor encouraged patient to be gentle with and allow herself to make mistakes and grow from them. Patient and counselor explored ways that patient can use this intentionality to move through her current difficulties.  Patient may benefit from ongoing CBT and Relapse Prevention Therapy.  PLAN: 1. Follow up with behavioral health clinician on : 05/27/18.  Angus Palmsegina Alexander, LCSW

## 2018-05-27 ENCOUNTER — Ambulatory Visit (INDEPENDENT_AMBULATORY_CARE_PROVIDER_SITE_OTHER): Payer: Medicare Other | Admitting: Licensed Clinical Social Worker

## 2018-05-27 ENCOUNTER — Ambulatory Visit (INDEPENDENT_AMBULATORY_CARE_PROVIDER_SITE_OTHER): Payer: Medicare Other | Admitting: Pharmacist

## 2018-05-27 DIAGNOSIS — B20 Human immunodeficiency virus [HIV] disease: Secondary | ICD-10-CM

## 2018-05-27 DIAGNOSIS — F331 Major depressive disorder, recurrent, moderate: Secondary | ICD-10-CM

## 2018-05-27 MED ORDER — NORTRIPTYLINE HCL 10 MG PO CAPS: 10.0000 mg | ORAL_CAPSULE | Freq: Every day | ORAL | 0 refills | Status: DC

## 2018-05-27 NOTE — Progress Notes (Signed)
HPI: Kara Munoz is a 62 y.o. female who presents to the [facility] pharmacy clinic for HIV follow-up.  Patient Active Problem List   Diagnosis Date Noted  . DYSLIPIDEMIA 02/19/2010  . HYPERTENSION NEC 12/25/2009  . CIGARETTE SMOKER 05/09/2008  . GLAUCOMA 05/09/2008  . APPENDECTOMY, HX OF 12/02/2006  . Human immunodeficiency virus (HIV) disease (HCC) 07/23/2006  . GENITAL HERPES 07/23/2006  . 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 WARTS, OTHER SPECIFIED VIRAL 07/23/2006  . SYPHILIS 07/23/2006  . ANEMIA-NOS 07/23/2006  . ANXIETY 07/23/2006  . Polysubstance abuse (HCC) 07/23/2006  . Depression 07/23/2006  . PNEUMONIA, RECURRENT 07/23/2006  . DENTAL CARIES 07/23/2006  . DYSPLASIA, CERVIX NOS 07/23/2006    Patient's Medications  New Prescriptions   No medications on file  Previous Medications   ACYCLOVIR CREAM (ZOVIRAX) 5 %    Apply topically.   ALBUTEROL (PROAIR HFA) 108 (90 BASE) MCG/ACT INHALER    Inhale into the lungs.   B COMPLEX VITAMINS (VITAMIN-B COMPLEX) TABS    Take by mouth.   BIMATOPROST (LUMIGAN) 0.01 % SOLN    Place 1 drop into both eyes at bedtime.   BIOTIN 1 MG CAPS    Take by mouth.   CALCIUM CARBONATE-VITAMIN D (OYSTER SHELL CALCIUM/D) 250-125 MG-UNIT TABS    Take 1 tablet by mouth.   CETIRIZINE (ZYRTEC) 10 MG TABLET    Take by mouth.   DARUNAVIR-COBICISCTAT-EMTRICITABINE-TENOFOVIR ALAFENAMIDE (SYMTUZA) 800-150-200-10 MG TABS    Take 1 tablet by mouth daily with breakfast.   DOCUSATE (COLACE) 50 MG/5ML LIQUID    Take by mouth.   ECONAZOLE NITRATE 1 % CREAM    Apply topically.   FISH OIL-OMEGA-3 FATTY ACIDS 1000 MG CAPSULE    Take 1 g by mouth daily.   MOMETASONE (NASONEX) 50 MCG/ACT NASAL SPRAY       MULTIPLE VITAMIN (THERA) TABS    Take 1 tablet by mouth.   NAPROXEN (NAPROSYN) 375 MG TABLET    TAKE ONE TABLET BY MOUTH TWICE DAILY with meals   NEOMYCIN-POLYMYXIN-DEXAMETHASONE (MAXITROL) 0.1 % OPHTHALMIC SUSPENSION    1 drop 4 (four) times daily.   NUTRITIONAL  SUPPLEMENT LIQD    Take 1 Can by mouth 2 (two) times daily. Boost supplement, chocolate flavor   PAZEO 0.7 % SOLN    INSTILL 1 DROP IN BOTH EYES DAILY IN THE MORNING.   PRAVASTATIN (PRAVACHOL) 40 MG TABLET       PSEUDOEPHEDRINE (SUDAFED) 30 MG TABLET    Take 30 mg by mouth.   SILVER SULFADIAZINE (SILVADENE) 1 % CREAM    Apply topically 2 (two) times daily.   TRIAMCINOLONE CREAM (KENALOG) 0.1 %    apply TWO TIMES DAILY   VALACYCLOVIR (VALTREX) 1000 MG TABLET    Take 1/2 tablet (500 mg total) by mouth 2 (two) times daily. for 5 days as needed.   VITAMIN E 200 UNIT CAPSULE    Take by mouth.  Modified Medications   Modified Medication Previous Medication   NORTRIPTYLINE (PAMELOR) 10 MG CAPSULE nortriptyline (PAMELOR) 10 MG capsule      Take 1 capsule (10 mg total) by mouth at bedtime.    Take 10 mg by mouth at bedtime.  Discontinued Medications   No medications on file    Allergies: Allergies  Allergen Reactions  . Amoxicillin-Pot Clavulanate Diarrhea    Severe - Pt request it be listed as an allergy    Past Medical History: Past Medical History:  Diagnosis Date  . Depression   .  HIV infection (HCC)   . Hyperlipidemia     Social History: Social History   Socioeconomic History  . Marital status: Divorced    Spouse name: Not on file  . Number of children: Not on file  . Years of education: Not on file  . Highest education level: Not on file  Occupational History  . Not on file  Social Needs  . Financial resource strain: Not on file  . Food insecurity:    Worry: Not on file    Inability: Not on file  . Transportation needs:    Medical: Not on file    Non-medical: Not on file  Tobacco Use  . Smoking status: Current Every Day Smoker    Packs/day: 0.10    Years: 35.00    Pack years: 3.50    Types: Cigarettes  . Smokeless tobacco: Never Used  Substance and Sexual Activity  . Alcohol use: No    Alcohol/week: 0.0 standard drinks  . Drug use: No    Comment: recovering,  no now  . Sexual activity: Not Currently    Partners: Male    Comment: given condoms for grandchild  Lifestyle  . Physical activity:    Days per week: Not on file    Minutes per session: Not on file  . Stress: Not on file  Relationships  . Social connections:    Talks on phone: Not on file    Gets together: Not on file    Attends religious service: Not on file    Active member of club or organization: Not on file    Attends meetings of clubs or organizations: Not on file    Relationship status: Not on file  Other Topics Concern  . Not on file  Social History Narrative  . Not on file    Labs: Lab Results  Component Value Date   HIV1RNAQUANT 137 (H) 04/21/2018   HIV1RNAQUANT 967 (H) 03/30/2018   HIV1RNAQUANT 71 (H) 09/29/2017   CD4TABS 500 04/21/2018   CD4TABS 860 03/30/2018   CD4TABS 650 09/29/2017    RPR and STI Lab Results  Component Value Date   LABRPR NON-REACTIVE 03/30/2018   LABRPR NON REAC 03/26/2017   LABRPR NON REAC 03/18/2016   LABRPR NON REAC 03/20/2015   LABRPR NON REAC 08/01/2014    No flowsheet data found.  Hepatitis B Lab Results  Component Value Date   HEPBSAB No 12/07/2006   HEPBSAG No 12/07/2006   Hepatitis C No results found for: HEPCAB, HCVRNAPCRQN Hepatitis A No results found for: HAV Lipids: Lab Results  Component Value Date   CHOL 236 (H) 03/30/2018   TRIG 228 (H) 03/30/2018   HDL 67 03/30/2018   CHOLHDL 3.5 03/30/2018   VLDL 27 03/26/2017   LDLCALC 132 (H) 03/30/2018    Current HIV Regimen: Symtuza  Assessment: Kara Munoz is here today to follow-up with me again for her HIV infection. I saw her last month and changed her from Evotaz + Triumeq to [place]tuza due to viremia when checked back in June. She comes in today and states she really likes taking the Symtuza and has not missed a single dose since starting.  She is very anxious about her viral load and is hopeful it will be undetectable.  No side effects.  She takes it with  breakfast every morning.  Of note, she had a procedure done yesterday and wanted me to let Dr. Orvan Falconerampbell know. She said she had some hemorrhoids taken off but maybe they were  warts? She is doing fine now without issue.  She will see Dr. Orvan Falconerampbell in October. I will check another viral load today and let her know the results when they are back.  She continues to see Rene KocherRegina every month.  Plan: - Continue Symtuza PO once daily with food - HIV VL today - F/u with Dr. Orvan Falconerampbell 10/29 at 945am  Cassie L. Kuppelweiser, PharmD, AAHIVP, CPP Infectious Diseases Clinical Pharmacist [facility] 05/27/2018, 5:18 PM

## 2018-05-28 NOTE — BH Specialist Note (Signed)
Integrated Behavioral Health Follow Up Visit  MRN: [id number] Name: Kara Munoz  Number of Integrated Behavioral Health Clinician visits: 4/6 Session Start time: 10:08am  Session End time: 10:39am Total time: 30 minutes  Type of Service: Integrated Behavioral Health- Individual/Family Interpretor:No. Interpretor Name and Language: n/a  SUBJECTIVE: Kara MiloSandra Rossel is a 62 y.o. female accompanied by self Patient reports the following symptoms/concerns: worry about medical condition, easily angered  OBJECTIVE: Mood: Anxious and Irritable and Affect: Depressed Risk of harm to self or others: No plan to harm self or others  LIFE CONTEXT: Patient reports having a recurrence of hemorrhoids that has been distressing to her. She had an outpatient procedure yesterday to remove them (external only) and has been very anxious about their presence, the procedure and recovery and the possibility of them returning again. Patient reports that outside of her health, all has been going pretty well. She is no longer having using thoughts, and is focusing more on taking care of herself/getting back to undetectable.     GOALS ADDRESSED: Patient will: 1.  Reduce symptoms of: anxiety and depression    INTERVENTIONS: Interventions utilized:  Supportive Counseling and Reality Therapy   ASSESSMENT: Patient currently experiencing irritability and anxiety over medical situation. She expressed fears and anxiety about the recurrence of hemorrhoids, and identified that in the past one was cancerous. Although that was not the case this time, patient has quite a bit of anxiety about the possibility that they could return and be cancerous again in the future. Counselor pointed out to patient that this situation was very different than last time, and that she got positive results. Patient shared that her mother died of colon cancer, and that if she had not waited so long to treat it she may have had a better outcome.  She indicated that she fears there being cancer there that she does not know about and this taking her down the same path as her mother. Counselor guided patient in reality testing of this thought process. With coaching, patient was able to acknowledge that the fact that she is being checked regularly means any cancer that does return will be found before it is "too late." Patient discussed her mother's decision not to have surgery on her cancer, which is what made it "too late". Counselor challenged patient on the fact that she encouraged mom to have the surgery, so that likely means she would make a different choice if it were her. Patient acknowledged this. Counselor and patient explored ways to acknowledge patient's anxiety about her medical concerns in the context of her mother's experience, while honoring that she has control over her own decisions should that situation arise.    Patient may benefit from ongoing CBT.  PLAN: 1. Follow up with behavioral health clinician on : 06/28/18.   Angus Palmsegina Alexander, LCSW

## 2018-05-31 ENCOUNTER — Other Ambulatory Visit: Payer: Self-pay

## 2018-05-31 DIAGNOSIS — B20 Human immunodeficiency virus [HIV] disease: Secondary | ICD-10-CM

## 2018-05-31 LAB — HIV-1 RNA QUANT-NO REFLEX-BLD
HIV 1 RNA Quant: <20 copies/mL — AB
HIV-1 RNA Quant, Log: <1.3 Log copies/mL — AB

## 2018-05-31 MED ORDER — NUTRITIONAL SUPPLEMENT PO LIQD: 1.0000 | Freq: Two times a day (BID) | ORAL | 11 refills | Status: DC

## 2018-05-31 NOTE — Telephone Encounter (Signed)
Faxed script to case manager at [phone]

## 2018-05-31 NOTE — Telephone Encounter (Signed)
She can have refills.

## 2018-05-31 NOTE — Telephone Encounter (Signed)
Patient called office to have script for nutritional supplement faxed over to case manager at [facility]. Script must be faxed to Alyssia at [phone]. Will route message to Dr. Orvan Falconerampbell to see if it is okay to refill nourishment supplement for patient.  Kara CourierJose L Maldonado, [place]

## 2018-05-31 NOTE — Telephone Encounter (Signed)
Patient is calling for refill of Boost and requested to be faxed to case mananger.   Patient  also informed her viral load is undetectable.    Laurell Josephsammy K King, RN   Script will be faxed.   Laurell Josephsammy K King, RN

## 2018-06-01 ENCOUNTER — Telehealth: Payer: Self-pay | Admitting: *Deleted

## 2018-06-01 NOTE — Telephone Encounter (Signed)
Please tell Kara DavenportSandra that she has had some mild elevation of her total cholesterol on several occasions over the past decade.  She has had some kidney disease but that has been very stable for many years and not getting any worse.

## 2018-06-01 NOTE — Telephone Encounter (Signed)
Patient called, wanted Dr Blair Dolphinampbell's opinion on her most recent labs (from her PCP). She wanted Dr Orvan Falconerampbell to know that her cholesterol is high and that she needs to watch her diet, avoiding carbohydrates. She will work with her primary care to see if he can connect her to a nutritionist. She also wanted to make sure that Dr Orvan Falconerampbell was aware that she had Stage III Kidney Disease. She states that she always thought she had "an enlarged kidney" but did not realize that it was kidney disease.  She would like an opinion from Dr Orvan Falconerampbell. Andree CossHowell, Emmette Katt M, RN

## 2018-06-02 NOTE — Telephone Encounter (Signed)
Relayed information to patient. She was grateful.

## 2018-06-28 ENCOUNTER — Ambulatory Visit (INDEPENDENT_AMBULATORY_CARE_PROVIDER_SITE_OTHER): Payer: Medicare Other | Admitting: Licensed Clinical Social Worker

## 2018-06-28 DIAGNOSIS — F331 Major depressive disorder, recurrent, moderate: Secondary | ICD-10-CM | POA: Diagnosis not present
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
## 2018-08-10 ENCOUNTER — Encounter: Payer: Self-pay | Admitting: Internal Medicine

## 2018-08-10 ENCOUNTER — Ambulatory Visit: Payer: Medicare Other | Admitting: Licensed Clinical Social Worker

## 2018-08-10 ENCOUNTER — Ambulatory Visit (INDEPENDENT_AMBULATORY_CARE_PROVIDER_SITE_OTHER): Payer: Medicare Other | Admitting: Internal Medicine

## 2018-08-10 DIAGNOSIS — B20 Human immunodeficiency virus [HIV] disease: Secondary | ICD-10-CM | POA: Diagnosis not present

## 2018-08-10 DIAGNOSIS — F330 Major depressive disorder, recurrent, mild: Secondary | ICD-10-CM | POA: Diagnosis not present

## 2018-08-10 DIAGNOSIS — F17200 Nicotine dependence, unspecified, uncomplicated: Secondary | ICD-10-CM

## 2018-08-10 DIAGNOSIS — Z23 Encounter for immunization: Secondary | ICD-10-CM | POA: Diagnosis not present

## 2018-08-10 NOTE — Assessment & Plan Note (Signed)
Her infection is back under excellent control.  She will continue Symtuza and follow-up after lab work in 6 months.  She received her influenza vaccine today.

## 2018-08-10 NOTE — Assessment & Plan Note (Signed)
Her depression is improving.  She will continue nortriptyline.  She was due to see our counselor, Angus Palms, today but her ride needs to leave soon so she will reschedule.

## 2018-08-10 NOTE — Progress Notes (Signed)
Patient Active Problem List   Diagnosis Date Noted  . Human immunodeficiency virus (HIV) disease (HCC) 07/23/2006    Priority: High  . DYSLIPIDEMIA 02/19/2010    Priority: Medium  . HYPERTENSION NEC 12/25/2009    Priority: Medium  . CIGARETTE SMOKER 05/09/2008    Priority: Medium  . ANXIETY 07/23/2006    Priority: Medium  . Polysubstance abuse (HCC) 07/23/2006    Priority: Medium  . Depression 07/23/2006    Priority: Medium  . DYSPLASIA, CERVIX NOS 07/23/2006    Priority: Medium  . GLAUCOMA 05/09/2008  . APPENDECTOMY, HX OF 12/02/2006  . GENITAL HERPES 07/23/2006  . 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 WARTS, OTHER SPECIFIED VIRAL 07/23/2006  . SYPHILIS 07/23/2006  . ANEMIA-NOS 07/23/2006  . PNEUMONIA, RECURRENT 07/23/2006  . DENTAL CARIES 07/23/2006    Patient's Medications  New Prescriptions   No medications on file  Previous Medications   ACYCLOVIR CREAM (ZOVIRAX) 5 %    Apply topically.   ALBUTEROL (PROAIR HFA) 108 (90 BASE) MCG/ACT INHALER    Inhale into the lungs.   B COMPLEX VITAMINS (VITAMIN-B COMPLEX) TABS    Take by mouth.   BIMATOPROST (LUMIGAN) 0.01 % SOLN    Place 1 drop into both eyes at bedtime.   BIOTIN 1 MG CAPS    Take by mouth.   CALCIUM CARBONATE-VITAMIN D (OYSTER SHELL CALCIUM/D) 250-125 MG-UNIT TABS    Take 1 tablet by mouth.   CETIRIZINE (ZYRTEC) 10 MG TABLET    Take by mouth.   DARUNAVIR-COBICISCTAT-EMTRICITABINE-TENOFOVIR ALAFENAMIDE (SYMTUZA) 800-150-200-10 MG TABS    Take 1 tablet by mouth daily with breakfast.   DOCUSATE (COLACE) 50 MG/5ML LIQUID    Take by mouth.   ECONAZOLE NITRATE 1 % CREAM    Apply topically.   FISH OIL-OMEGA-3 FATTY ACIDS 1000 MG CAPSULE    Take 1 g by mouth daily.   MOMETASONE (NASONEX) 50 MCG/ACT NASAL SPRAY       MULTIPLE VITAMIN (THERA) TABS    Take 1 tablet by mouth.   NAPROXEN (NAPROSYN) 375 MG TABLET    TAKE ONE TABLET BY MOUTH TWICE DAILY with meals   NEOMYCIN-POLYMYXIN-DEXAMETHASONE (MAXITROL)  0.1 % OPHTHALMIC SUSPENSION    1 drop 4 (four) times daily.   NORTRIPTYLINE (PAMELOR) 10 MG CAPSULE    Take 1 capsule (10 mg total) by mouth at bedtime.   NUTRITIONAL SUPPLEMENT LIQD    Take 1 Can by mouth 2 (two) times daily. Boost supplement, chocolate flavor   PAZEO 0.7 % SOLN    INSTILL 1 DROP IN BOTH EYES DAILY IN THE MORNING.   PRAVASTATIN (PRAVACHOL) 40 MG TABLET       PSEUDOEPHEDRINE (SUDAFED) 30 MG TABLET    Take 30 mg by mouth.   SILVER SULFADIAZINE (SILVADENE) 1 % CREAM    Apply topically 2 (two) times daily.   TRIAMCINOLONE CREAM (KENALOG) 0.1 %    apply TWO TIMES DAILY   VALACYCLOVIR (VALTREX) 1000 MG TABLET    Take 1/2 tablet (500 mg total) by mouth 2 (two) times daily. for 5 days as needed.   VITAMIN E 200 UNIT CAPSULE    Take by mouth.  Modified Medications   No medications on file  Discontinued Medications   No medications on file    Subjective: Kara Munoz is in for her routine follow-up visit.  I changed her to Symtuza recently.  She takes it each morning with breakfast and tolerates it well.  She denies missing any doses.  Her depression is under better control on nortriptyline.  She is cutting down on cigarettes and is going to start using nicotine patches.  She hopes to be able to quit soon.  Review of Systems: Review of Systems  Constitutional: Negative for chills, diaphoresis, fever, malaise/fatigue and weight loss.  HENT: Negative for sore throat.   Respiratory: Negative for cough, sputum production and shortness of breath.   Cardiovascular: Negative for chest pain.  Gastrointestinal: Negative for abdominal pain, diarrhea, heartburn, nausea and vomiting.  Genitourinary: Negative for dysuria and frequency.  Musculoskeletal: Negative for joint pain and myalgias.  Skin: Negative for rash.  Neurological: Negative for dizziness and headaches.  Psychiatric/Behavioral: Positive for depression. Negative for substance abuse and suicidal ideas. The patient is not  nervous/anxious.     Past Medical History:  Diagnosis Date  . Depression   . HIV infection (HCC)   . Hyperlipidemia     Social History   Tobacco Use  . Smoking status: Current Every Day Smoker    Packs/day: 0.10    Years: 35.00    Pack years: 3.50    Types: Cigarettes  . Smokeless tobacco: Never Used  Substance Use Topics  . Alcohol use: No    Alcohol/week: 0.0 standard drinks  . Drug use: No    Comment: recovering, no now    No family history on file.  Allergies  Allergen Reactions  . Amoxicillin-Pot Clavulanate Diarrhea    Severe - Pt request it be listed as an allergy    Health Maintenance  Topic Date Due  . TETANUS/TDAP  03/19/1973  . MAMMOGRAM  03/19/2004  . COLONOSCOPY  03/19/2004  . PAP SMEAR  06/28/2012  . INFLUENZA VACCINE  05/13/2018  . Hepatitis C Screening  Completed  . HIV Screening  Completed    Objective:  Vitals:   08/10/18 0904  BP: 129/89  Pulse: 94  Temp: 98.3 F (36.8 C)  Weight: 130 lb (59 kg)   Body mass index is 20.98 kg/m.  Physical Exam  Constitutional: She is oriented to person, place, and time.  She is smiling, joking and in very good spirits today.  HENT:  Mouth/Throat: No oropharyngeal exudate.  Eyes: Conjunctivae are normal.  Cardiovascular: Normal rate, regular rhythm and normal heart sounds.  No murmur heard. Pulmonary/Chest: Breath sounds normal.  Abdominal: Soft. She exhibits no mass. There is no tenderness.  Musculoskeletal: Normal range of motion.  Neurological: She is alert and oriented to person, place, and time.  Skin: No rash noted.  Psychiatric: She has a normal mood and affect.    Lab Results Lab Results  Component Value Date   WBC 7.4 03/30/2018   HGB 14.9 03/30/2018   HCT 44.4 03/30/2018   MCV 88.4 03/30/2018   PLT 220 03/30/2018    Lab Results  Component Value Date   CREATININE 1.28 (H) 04/21/2018   BUN 16 04/21/2018   NA 140 04/21/2018   K 4.4 04/21/2018   CL 106 04/21/2018   CO2 28  04/21/2018    Lab Results  Component Value Date   ALT 16 04/21/2018   AST 19 04/21/2018   ALKPHOS 84 03/26/2017   BILITOT 2.7 (H) 04/21/2018    Lab Results  Component Value Date   CHOL 236 (H) 03/30/2018   HDL 67 03/30/2018   LDLCALC 132 (H) 03/30/2018   TRIG 228 (H) 03/30/2018   CHOLHDL 3.5 03/30/2018   Lab Results  Component Value Date  LABRPR NON-REACTIVE 03/30/2018   HIV 1 RNA Quant (copies/mL)  Date Value  05/27/2018 <20 DETECTED (A)  04/21/2018 137 (H)  03/30/2018 967 (H)   CD4 T Cell Abs (/uL)  Date Value  04/21/2018 500  03/30/2018 860  09/29/2017 650     Problem List Items Addressed This Visit      High   Human immunodeficiency virus (HIV) disease (HCC)    Her infection is back under excellent control.  She will continue Symtuza and follow-up after lab work in 6 months.  She received her influenza vaccine today.      Relevant Orders   T-helper cell (CD4)- ([facility] only)   HIV-1 RNA quant-no reflex-bld   CBC   Comprehensive metabolic panel   Lipid panel   RPR     Medium   Depression    Her depression is improving.  She will continue nortriptyline.  She was due to see our counselor, Angus Palms, today but her ride needs to leave soon so she will reschedule.      CIGARETTE SMOKER    I encouraged her to go through with her plan to quit smoking.           Cliffton Asters, MD [facility] [phone] pager   [phone] cell 08/10/2018, 9:24 AM

## 2018-08-10 NOTE — Assessment & Plan Note (Signed)
I encouraged her to go through with her plan to quit smoking. 

## 2018-08-24 ENCOUNTER — Ambulatory Visit (INDEPENDENT_AMBULATORY_CARE_PROVIDER_SITE_OTHER): Payer: Medicare Other | Admitting: Licensed Clinical Social Worker

## 2018-08-24 DIAGNOSIS — F331 Major depressive disorder, recurrent, moderate: Secondary | ICD-10-CM | POA: Diagnosis not present
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
## 2018-09-13 ENCOUNTER — Other Ambulatory Visit: Payer: Self-pay | Admitting: Internal Medicine

## 2018-09-13 DIAGNOSIS — B20 Human immunodeficiency virus [HIV] disease: Secondary | ICD-10-CM

## 2018-09-30 ENCOUNTER — Ambulatory Visit (INDEPENDENT_AMBULATORY_CARE_PROVIDER_SITE_OTHER): Payer: Medicare Other | Admitting: Licensed Clinical Social Worker

## 2018-09-30 DIAGNOSIS — F331 Major depressive disorder, recurrent, moderate: Secondary | ICD-10-CM

## 2018-09-30 NOTE — BH Specialist Note (Signed)
Integrated Behavioral Health Follow Up Visit  MRN: [id number] Name: Kara Munoz  Session Start time: 10:03am  Session End time: 10:35am Total time: 30 minutes  Type of Service: Integrated Behavioral Health- Individual/Family Interpretor:No. Interpretor Name and Language: n/a  SUBJECTIVE: Kara Munoz is a 62 y.o. female accompanied by self Patient reports the following symptoms/concerns: depression, little enjoyment from activities, anxiety/worry, little motivation, irritable  OBJECTIVE: Mood: Depressed and Affect: Depressed Risk of harm to self or others: No plan to harm self or others  LIFE CONTEXT: Patient's care has been problematic for her lately. In less than a year she has replaced several major parts of the car, and now she cannot get it to start due to a problem with her key and having it reprogrammed. Patient states that she has come to terms with the fact that she and boyfriend are now housemates rather than romantically involved, though they both still love one another and neither will date anyone else. She indicates that she no longer has interest in a sexual relationship, so she is no longer bothered by the lack of intimacy. Patient and boyfriend continue to live together (she upstairs and he downstairs), do the shopping together, and spend time talking. They do not plan to celebrate the holidays together or take any trips together.   GOALS ADDRESSED: Patient will: 1.  Reduce symptoms of: depression   INTERVENTIONS: Interventions utilized:  Brief CBT and Supportive Counseling  ASSESSMENT: Patient currently experiencing depressed mood, anhedonia., anxiety, low motivation, irritability. She reports that she has been feeling depressed lately, "mostly because of material things". Counselor guided patient to explore the stressors she feels are connected to the depressed state. Patient initially talked about her car, but with further guidance identified that the holidays/her  mother's birthday are also main stressors. Counselor emphasized to patient that it is okay to feel however she feels around this time of year. Mother's birthday was 1/1, and patient reports that this is the first year since her passing that the family is not going to be getting together to go to the [place]. She also indicates that she has not found anyone to ride with her to visit the 1 or 2 remaining elderly aunts who lives in [place], and her grand-daughter's family are all sick with the flu so she cannot be around them. Counselor normalized the experience of the family no longer coming together once the older generation has mostly died, and validated patient's sadness in this area. Counselor encouraged patient to find her own way to honor her mother and to celebrate whatever of the holidays she chooses to. Patient stated that she will likely get to see her godchildren, who she babysat when they lived beside her a few years ago, and pointed out that she spent some time with them this past weekend. Counselor commended patient for finding time with family, even if not biological family.   Patient may benefit from ongoing CBT and supportive counseling.  PLAN: 1. Follow up with behavioral health clinician on : 11/01/18   Angus Palmsegina Quavion Boule, LCSW

## 2018-11-01 ENCOUNTER — Ambulatory Visit (INDEPENDENT_AMBULATORY_CARE_PROVIDER_SITE_OTHER): Payer: Medicare Other | Admitting: Licensed Clinical Social Worker

## 2018-11-01 DIAGNOSIS — F331 Major depressive disorder, recurrent, moderate: Secondary | ICD-10-CM | POA: Diagnosis not present
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
## 2018-11-02 ENCOUNTER — Ambulatory Visit: Payer: Medicare Other | Admitting: Licensed Clinical Social Worker

## 2018-11-22 ENCOUNTER — Ambulatory Visit (INDEPENDENT_AMBULATORY_CARE_PROVIDER_SITE_OTHER): Payer: Medicare Other | Admitting: Licensed Clinical Social Worker

## 2018-11-22 DIAGNOSIS — F331 Major depressive disorder, recurrent, moderate: Secondary | ICD-10-CM

## 2018-11-22 NOTE — BH Specialist Note (Signed)
Integrated Behavioral Health Follow Up Visit  MRN: [id number] Name: Kara Munoz   Session Start time: 10:10am  Session End time: 10:40am Total time: 30 minutes  Type of Service: Integrated Behavioral Health- Individual/Family Interpretor:No. Interpretor Name and Language: n/a  SUBJECTIVE: Kara Munoz is a 63 y.o. female accompanied by self Patient reports the following symptoms/concerns: anxiety about outcome of court case/mediation  OBJECTIVE: Mood: Anxious and Affect: Constricted Risk of harm to self or others: No plan to harm self or others  LIFE CONTEXT: Patient reports that she has been getting along better with daughter, but does not trust her. She indicates that she and boyfriend have been talking about the situation with his son and daughter-in-law, and she has set some boundaries for herself, but does not want to keep boyfriend from his son once mediation is over.   GOALS ADDRESSED: Patient will: 1.  Reduce symptoms of: anxiety   INTERVENTIONS: Interventions utilized:  Solution-Focused Strategies and Supportive Counseling  ASSESSMENT: Patient currently experiencing anxiety about the outcome of her court date/ mediation with boyfriend's son and daughter-in-law. Counselor guided her to process her experience at court last week. Patient reported that boyfriend's son & daughter-in-law still do not have a Clinical research associate, so nothing was done at court. She stated that they agreed on mediation on April 13. Counselor explored this option with patient, who stated that her granddaughter, [name], had suggested it so she took the opportunity when it was presented. Counselor processed with patient ways that the looming court date impacted her leading up to last week. Patient described anxiety in several areas. She reported that she couldn't stop worrying, was unable to sleep, ate even less than usual, and had a general feeling of dread. Counselor and patient brainstormed ways to deal with the  uncertainty between now and April 13. Patient identified that having the mediation coming up has helped to relieve some of her anxiety. Counselor explored this statement with patient. Patient reported that she likes knowing that the outcome will be agreed on instead of handed down from an uninvolved party, and that she feels peace about getting to tell boyfriend's son and daughter-in-law how she feels about what happened. Counselor and patient explored ways to deal with anxiety if it arises, and appropriate ways to express her thoughts and feelings in mediation. Patient stated that she has told boyfriend that after mediation if he wants to invite his son back to work with him, she would be fine with that as long as he does not allow son to drive her truck or use other parts of her property. She reported that she and boyfriend have talked about son using him, and decided on limits that will be adhered to if son wants back in boyfriend's life (as son was the one who gave the "her or me" ultimatum to begin with, not boyfriend). Counselor commended patient for communicating with boyfriend and for setting boundaries that protect her and her property.  Patient may benefit from monthly counseling sessions.  PLAN: 1. Follow up with behavioral health clinician on : 01/04/19@10am    Angus Palms, LCSW

## 2019-01-04 ENCOUNTER — Ambulatory Visit (INDEPENDENT_AMBULATORY_CARE_PROVIDER_SITE_OTHER): Payer: Medicare Other | Admitting: Licensed Clinical Social Worker

## 2019-01-04 ENCOUNTER — Other Ambulatory Visit: Payer: Self-pay

## 2019-01-04 DIAGNOSIS — F331 Major depressive disorder, recurrent, moderate: Secondary | ICD-10-CM

## 2019-01-04 NOTE — BH Specialist Note (Signed)
Integrated Behavioral Health Follow Up Visit  MRN: [id number] Name: Kara Munoz  Session Start time: 10:10am Session End time:10:40am Total time: 30 minutes  Type of Service: Integrated Behavioral Health- Individual/Family Interpretor:No. Interpretor Name and Language:n/a  SUBJECTIVE: Kara Munoz is a 63 y.o. female accompanied by self Patient reports the following symptoms/concerns: anxiety, fatigue, irritability  OBJECTIVE: Mood: Anxious and Affect: Constricted Risk of harm to self or others: No plan to harm self or others  LIFE CONTEXT: Patient reports that social distancing does not bother her because she prefers to stay home and be by herself anyway, but she does want to go to church in person. She and boyfriend are both home all day, every day now.  GOALS ADDRESSED: Patient will: 1.  Reduce symptoms of: anxiety   INTERVENTIONS: Interventions utilized:  Solution-Focused Strategies and Supportive Counseling  ASSESSMENT: Patient currently experiencing anxiety, irritability and fatigue. She asks several times about obtaining an N95 mask and hand sanitizer due to the COVID19 outbreak. Counselor explained to patient that she was screened and not at-risk so she did not receive a mask. Counselor guided patient to explore her anxieties about the virus. Patient states that she recently had the flu, and is worried that she is not well enough to resist the virus, especially with HIV putting her in a high risk category. Counselor explained that undetectable HIV does not raise her risk, though an unhealed respiratory system may, and encouraged her to stay home as much as possible. Patient indicated that she has been, but that she did have a mask until recently, and would wear it to go out walking with a friend or in stores. She stated that she did not go to church last week, but plans on going this week. Counselor explored this decision with patient. She states that she needs to use her  spirituality to lean on. Counselor and patient brainstormed ways that patient can practice and strengthen spirituality from home. Counselor guided patient to discuss ways she can deal with her anxiety in this situation. Patient identified reading, staying in touch with friends via phone and social media. Counselor and patient brainstormed grounding and calming activities patient in do in addition to these things.  Patient may benefit from ongoing telephone sessions of CBT monthly.  PLAN: 1. Follow up with behavioral health clinician on : 02/10/19 @ 11:30  Angus Palms, LCSW

## 2019-02-10 ENCOUNTER — Encounter: Payer: Self-pay | Admitting: Internal Medicine

## 2019-02-10 ENCOUNTER — Other Ambulatory Visit: Payer: Medicare Other

## 2019-02-10 ENCOUNTER — Other Ambulatory Visit: Payer: Self-pay

## 2019-02-10 ENCOUNTER — Ambulatory Visit (INDEPENDENT_AMBULATORY_CARE_PROVIDER_SITE_OTHER): Payer: Medicare Other | Admitting: Internal Medicine

## 2019-02-10 VITALS — BP 144/86 | HR 87 | Temp 98.0°F | Ht 66.5 in | Wt 134.0 lb

## 2019-02-10 DIAGNOSIS — F17200 Nicotine dependence, unspecified, uncomplicated: Secondary | ICD-10-CM

## 2019-02-10 DIAGNOSIS — B20 Human immunodeficiency virus [HIV] disease: Secondary | ICD-10-CM

## 2019-02-10 DIAGNOSIS — F330 Major depressive disorder, recurrent, mild: Secondary | ICD-10-CM | POA: Diagnosis not present

## 2019-02-10 DIAGNOSIS — Z23 Encounter for immunization: Secondary | ICD-10-CM

## 2019-02-10 NOTE — Assessment & Plan Note (Signed)
After a bump in her viral load 1 year ago her viral load was back to undetectable levels in August.  It appears that her adherence is very good.  She will continue Symtuza, get blood work today and follow-up in 6 months.  She received Prevnar today.

## 2019-02-10 NOTE — Assessment & Plan Note (Signed)
I talked to her again about the importance of cigarette cessation. 

## 2019-02-10 NOTE — Progress Notes (Signed)
Patient Active Problem List   Diagnosis Date Noted  . Human immunodeficiency virus (HIV) disease (HCC) 07/23/2006    Priority: High  . DYSLIPIDEMIA 02/19/2010    Priority: Medium  . HYPERTENSION NEC 12/25/2009    Priority: Medium  . CIGARETTE SMOKER 05/09/2008    Priority: Medium  . ANXIETY 07/23/2006    Priority: Medium  . Polysubstance abuse (HCC) 07/23/2006    Priority: Medium  . Depression 07/23/2006    Priority: Medium  . DYSPLASIA, CERVIX NOS 07/23/2006    Priority: Medium  . GLAUCOMA 05/09/2008  . APPENDECTOMY, HX OF 12/02/2006  . GENITAL HERPES 07/23/2006  . 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 WARTS, OTHER SPECIFIED VIRAL 07/23/2006  . SYPHILIS 07/23/2006  . ANEMIA-NOS 07/23/2006  . PNEUMONIA, RECURRENT 07/23/2006  . DENTAL CARIES 07/23/2006    Patient's Medications  New Prescriptions   No medications on file  Previous Medications   ACYCLOVIR CREAM (ZOVIRAX) 5 %    Apply topically.   ALBUTEROL (PROAIR HFA) 108 (90 BASE) MCG/ACT INHALER    Inhale into the lungs.   B COMPLEX VITAMINS (VITAMIN-B COMPLEX) TABS    Take by mouth.   BIMATOPROST (LUMIGAN) 0.01 % SOLN    Place 1 drop into both eyes at bedtime.   BIOTIN 1 MG CAPS    Take by mouth.   CALCIUM CARBONATE-VITAMIN D (OYSTER SHELL CALCIUM/D) 250-125 MG-UNIT TABS    Take 1 tablet by mouth.   CETIRIZINE (ZYRTEC) 10 MG TABLET    Take by mouth.   DOCUSATE (COLACE) 50 MG/5ML LIQUID    Take by mouth.   ECONAZOLE NITRATE 1 % CREAM    Apply topically.   FISH OIL-OMEGA-3 FATTY ACIDS 1000 MG CAPSULE    Take 1 g by mouth daily.   FLUTICASONE (CUTIVATE) 0.005 % OINTMENT    APPLY TO AFFECTED AREA once daily AS NEEDED   FLUTICASONE (FLONASE) 50 MCG/ACT NASAL SPRAY    Place into the nose.   HYDROCORTISONE (PROCTOZONE-HC) 2.5 % RECTAL CREAM    place rectally TWO TIMES DAILY for 10 days   MOMETASONE (NASONEX) 50 MCG/ACT NASAL SPRAY       MULTIPLE VITAMIN (THERA) TABS    Take 1 tablet by mouth.   NAPROXEN  (NAPROSYN) 375 MG TABLET    TAKE ONE TABLET BY MOUTH TWICE DAILY with meals   NORTRIPTYLINE (PAMELOR) 10 MG CAPSULE    Take 1 capsule (10 mg total) by mouth at bedtime.   NUTRITIONAL SUPPLEMENT LIQD    Take 1 Can by mouth 2 (two) times daily. Boost supplement, chocolate flavor   ONDANSETRON (ZOFRAN-ODT) 4 MG DISINTEGRATING TABLET    dissolve one tablet by mouth every 8 hours AS NEEDED for up to 5 days for nausea   PAZEO 0.7 % SOLN    INSTILL 1 DROP IN BOTH EYES DAILY IN THE MORNING.   PRAVASTATIN (PRAVACHOL) 40 MG TABLET       PSEUDOEPHEDRINE (SUDAFED) 30 MG TABLET    Take 30 mg by mouth.   SILVER SULFADIAZINE (SILVADENE) 1 % CREAM    Apply topically 2 (two) times daily.   SYMTUZA 800-150-200-10 MG TABS    take 1 TABLET BY MOUTH daily with breakfast   TRIAMCINOLONE CREAM (KENALOG) 0.1 %    apply TWO TIMES DAILY   VALACYCLOVIR (VALTREX) 1000 MG TABLET    Take 1/2 tablet (500 mg total) by mouth 2 (two) times daily. for 5 days as needed.  VITAMIN E 200 UNIT CAPSULE    Take by mouth.  Modified Medications   No medications on file  Discontinued Medications   NEOMYCIN-POLYMYXIN-DEXAMETHASONE (MAXITROL) 0.1 % OPHTHALMIC SUSPENSION    1 drop 4 (four) times daily.    Subjective: Kara Munoz is in for her routine HIV follow-up visit.  She has had no problems obtaining, taking or tolerating her Symtuza and denies missing any doses.  She has been struggling some during the COVID-19 epidemic because her family does not take social distancing as seriously as she thinks they should.  She has had some arguments with her granddaughter about this and told her that she would not babysit for her great granddaughter since they were not socially distancing themselves.  She continues to smoke cigarettes and has no current plan to quit.  Review of Systems: Review of Systems  Constitutional: Negative for chills, diaphoresis, fever, malaise/fatigue and weight loss.  HENT: Negative for sore throat.   Respiratory: Negative  for cough, sputum production and shortness of breath.   Cardiovascular: Negative for chest pain.  Gastrointestinal: Negative for abdominal pain, diarrhea, heartburn, nausea and vomiting.  Genitourinary: Negative for dysuria and frequency.  Musculoskeletal: Negative for joint pain and myalgias.  Skin: Negative for rash.  Neurological: Negative for dizziness and headaches.  Psychiatric/Behavioral: Positive for depression. Negative for substance abuse. The patient is not nervous/anxious.     Past Medical History:  Diagnosis Date  . Depression   . HIV infection (HCC)   . Hyperlipidemia     Social History   Tobacco Use  . Smoking status: Light Tobacco Smoker    Packs/day: 0.10    Years: 35.00    Pack years: 3.50    Types: Cigarettes  . Smokeless tobacco: Never Used  . Tobacco comment: 1 per day  Substance Use Topics  . Alcohol use: No    Alcohol/week: 0.0 standard drinks  . Drug use: No    Comment: recovering, no now    No family history on file.  Allergies  Allergen Reactions  . Amoxicillin-Pot Clavulanate Diarrhea    Severe - Pt request it be listed as an allergy    Health Maintenance  Topic Date Due  . TETANUS/TDAP  03/19/1973  . MAMMOGRAM  03/19/2004  . COLONOSCOPY  03/19/2004  . PAP SMEAR-Modifier  06/28/2012  . INFLUENZA VACCINE  05/14/2019  . Hepatitis C Screening  Completed  . HIV Screening  Completed    Objective:  Vitals:   02/10/19 0909  BP: (!) 144/86  Pulse: 87  Temp: 98 F (36.7 C)  SpO2: 99%  Weight: 134 lb (60.8 kg)  Height: 5' 6.5" (1.689 m)   Body mass index is 21.3 kg/m.  Physical Exam Constitutional:      Comments: She is smiling, talkative and in good spirits.  HENT:     Mouth/Throat:     Pharynx: No oropharyngeal exudate.  Eyes:     Conjunctiva/sclera: Conjunctivae normal.  Cardiovascular:     Rate and Rhythm: Normal rate and regular rhythm.     Heart sounds: No murmur.  Pulmonary:     Breath sounds: Normal breath  sounds.  Abdominal:     Palpations: Abdomen is soft. There is no mass.     Tenderness: There is no abdominal tenderness.  Musculoskeletal: Normal range of motion.  Skin:    Findings: No rash.  Neurological:     Mental Status: She is alert and oriented to person, place, and time.  Psychiatric:  Mood and Affect: Mood normal.     Lab Results Lab Results  Component Value Date   WBC 7.4 03/30/2018   HGB 14.9 03/30/2018   HCT 44.4 03/30/2018   MCV 88.4 03/30/2018   PLT 220 03/30/2018    Lab Results  Component Value Date   CREATININE 1.28 (H) 04/21/2018   BUN 16 04/21/2018   NA 140 04/21/2018   K 4.4 04/21/2018   CL 106 04/21/2018   CO2 28 04/21/2018    Lab Results  Component Value Date   ALT 16 04/21/2018   AST 19 04/21/2018   ALKPHOS 84 03/26/2017   BILITOT 2.7 (H) 04/21/2018    Lab Results  Component Value Date   CHOL 236 (H) 03/30/2018   HDL 67 03/30/2018   LDLCALC 132 (H) 03/30/2018   TRIG 228 (H) 03/30/2018   CHOLHDL 3.5 03/30/2018   Lab Results  Component Value Date   LABRPR NON-REACTIVE 03/30/2018   HIV 1 RNA Quant (copies/mL)  Date Value  05/27/2018 <20 DETECTED (A)  04/21/2018 137 (H)  03/30/2018 967 (H)   CD4 T Cell Abs (/uL)  Date Value  04/21/2018 500  03/30/2018 860  09/29/2017 650     Problem List Items Addressed This Visit      High   Human immunodeficiency virus (HIV) disease (HCC)    After a bump in her viral load 1 year ago her viral load was back to undetectable levels in August.  It appears that her adherence is very good.  She will continue Symtuza, get blood work today and follow-up in 6 months.  She received Prevnar today.      Relevant Orders   T-helper cell (CD4)- ([facility] clinic only)   T-helper cell (CD4)- ([facility] clinic only)   HIV-1 RNA quant-no reflex-bld   CBC   Comprehensive metabolic panel   RPR   Lipid panel     Medium   Depression    I will arrange an E visit with Angus Palms, our behavioral health  counselor.      CIGARETTE SMOKER    I talked to her again about the importance of cigarette cessation.           Cliffton Asters, MD [facility] [phone] pager   [phone] cell 02/10/2019, 9:34 AM

## 2019-02-10 NOTE — Addendum Note (Signed)
Addended by: Andree Coss on: 02/10/2019 12:26 PM   Modules accepted: Orders

## 2019-02-10 NOTE — Assessment & Plan Note (Signed)
I will arrange an E visit with Angus Palms, our behavioral health counselor.

## 2019-02-11 LAB — T-HELPER CELL (CD4) - (RCID CLINIC ONLY)
CD4 % Helper T Cell: 34 % (ref 33–65)
CD4 T Cell Abs: 698 /uL (ref 400–1790)

## 2019-02-15 LAB — COMPREHENSIVE METABOLIC PANEL
AG Ratio: 1.6 (calc) (ref 1.0–2.5)
ALT: 18 U/L (ref 6–29)
AST: 18 U/L (ref 10–35)
Albumin: 4.1 g/dL (ref 3.6–5.1)
Alkaline phosphatase (APISO): 88 U/L (ref 37–153)
BUN/Creatinine Ratio: 13 (calc) (ref 6–22)
BUN: 14 mg/dL (ref 7–25)
CO2: 33 mmol/L — ABNORMAL HIGH (ref 20–32)
Calcium: 10.4 mg/dL (ref 8.6–10.4)
Chloride: 107 mmol/L (ref 98–110)
Creat: 1.11 mg/dL — ABNORMAL HIGH (ref 0.50–0.99)
Globulin: 2.6 g/dL (calc) (ref 1.9–3.7)
Glucose, Bld: 93 mg/dL (ref 65–99)
Potassium: 4 mmol/L (ref 3.5–5.3)
Sodium: 144 mmol/L (ref 135–146)
Total Bilirubin: 0.5 mg/dL (ref 0.2–1.2)
Total Protein: 6.7 g/dL (ref 6.1–8.1)

## 2019-02-15 LAB — RPR: RPR Ser Ql: NONREACTIVE

## 2019-02-15 LAB — LIPID PANEL
Cholesterol: 266 mg/dL — ABNORMAL HIGH (ref <200)
HDL: 75 mg/dL (ref >=50)
LDL Cholesterol (Calc): 162 mg/dL (calc) — ABNORMAL HIGH
Non-HDL Cholesterol (Calc): 191 mg/dL (calc) — ABNORMAL HIGH (ref <130)
Total CHOL/HDL Ratio: 3.5 (calc) (ref <5.0)
Triglycerides: 144 mg/dL (ref <150)

## 2019-02-15 LAB — CBC
HCT: 43.5 % (ref 35.0–45.0)
Hemoglobin: 14.2 g/dL (ref 11.7–15.5)
MCH: 28.3 pg (ref 27.0–33.0)
MCHC: 32.6 g/dL (ref 32.0–36.0)
MCV: 86.8 fL (ref 80.0–100.0)
MPV: 11.4 fL (ref 7.5–12.5)
Platelets: 244 10*3/uL (ref 140–400)
RBC: 5.01 10*6/uL (ref 3.80–5.10)
RDW: 14.7 % (ref 11.0–15.0)
WBC: 8.5 10*3/uL (ref 3.8–10.8)

## 2019-02-15 LAB — HIV-1 RNA QUANT-NO REFLEX-BLD
HIV 1 RNA Quant: <20 copies/mL
HIV-1 RNA Quant, Log: <1.3 Log copies/mL

## 2019-02-24 ENCOUNTER — Other Ambulatory Visit: Payer: Self-pay

## 2019-02-24 ENCOUNTER — Ambulatory Visit (INDEPENDENT_AMBULATORY_CARE_PROVIDER_SITE_OTHER): Payer: Medicare Other | Admitting: Licensed Clinical Social Worker

## 2019-02-24 DIAGNOSIS — F331 Major depressive disorder, recurrent, moderate: Secondary | ICD-10-CM

## 2019-02-24 NOTE — BH Specialist Note (Signed)
Integrated Behavioral Health Visit via Telemedicine (Telephone)  02/24/2019 Kara Munoz [id number]   Session Start time: 11:00am  Session End time: 11:30am Total time: 30 minutes  Type of Visit: Telephonic Patient location: Patient's home [facility] Provider location: Counselor's home office All persons participating in visit: Counselor & patient  Confirmed patient's address: Yes  Confirmed patient's phone number: Yes  Any changes to demographics: No   Confirmed patient's insurance: Yes  Any changes to patient's insurance: No   Discussed confidentiality: Yes    The following statements were read to the patient and/or legal guardian that are established with the [facility] Provider.  "The purpose of this phone visit is to provide behavioral health care while limiting exposure to the coronavirus (COVID19).  There is a possibility of technology failure and discussed alternative modes of communication if that failure occurs."  "By engaging in this telephone visit, you consent to the provision of healthcare.  Additionally, you authorize for your insurance to be billed for the services provided during this telephone visit."   Patient and/or legal guardian consented to telephone visit: Yes   PRESENTING CONCERNS: Patient and/or family reports the following symptoms/concerns: recent cravings/urges, feeling down about cravings, low self-concept Duration of problem: 2-3 weeks; Severity of problem: moderate  STRENGTHS (Protective Factors/Coping Skills): Intelligent, Long history of recovery/sobriety, Supportive sponsor, Motivated for wellbeing  GOALS ADDRESSED: Patient will: 1.  Reduce symptoms of: depression   INTERVENTIONS: Interventions utilized:  Supportive Counseling and Relapse Prevention Therapy  ASSESSMENT: Patient currently experiencing recent cravings/urges, feeling down about cravings, low self-concept . She reports that she does not think she would have used, but the  cravings were getting stronger so she went to stay at her sponsor's house a few days for accountability. She stated that she feels down about how bad the cravings got. Counselor normalized this, and emphasized the fact that patient was able to resist such great  temptation by taking the steps she did. Counselor encouraged patient in refocusing on this rather than the fact that the cravings existed. Patient identified that she is triggered by having extra money, and receiving her stimulus check was a huge trigger. Counselor commended patient for recognizing the trigger and enlisting help to deal with it. Patient and counselor discussed other triggers patient has identified. She indicates that August will be 5 years clean, but she tries not to celebrate recovery dates because they trigger her. This year, it is on her mind because she has never passed the 5 year mark before. Counselor guided patient in relapse prevention planning. Counselor encouraged patient to identify reasons not to use, and write them down. Patient talked about spending time with her grandchildren, who she would not be able to see if she used, and the progress in the relationship with her daughter. Counselor discussed with patient using these as not only a reason not to use, but things to look at when depressed to see the progress she has made.   Patient may benefit from monthly counseling sessions  PLAN: 1. Follow up with behavioral health clinician on : 03/28/19 @ 11am  Angus Palms

## 2019-03-02 ENCOUNTER — Telehealth: Payer: Self-pay | Admitting: *Deleted

## 2019-03-02 NOTE — Telephone Encounter (Signed)
Patient requests that her labs be faxed to her case worker Henrietta Hoover, with [facility]. RN left message at [facility] asking for call back to confirm fax number.  Numbers provided by patient are: P: [phone] F: [phone] Andree Coss, RN

## 2019-03-14 ENCOUNTER — Other Ambulatory Visit: Payer: Self-pay | Admitting: Internal Medicine

## 2019-03-14 DIAGNOSIS — B20 Human immunodeficiency virus [HIV] disease: Secondary | ICD-10-CM

## 2019-03-29 ENCOUNTER — Other Ambulatory Visit: Payer: Self-pay

## 2019-03-29 ENCOUNTER — Ambulatory Visit (INDEPENDENT_AMBULATORY_CARE_PROVIDER_SITE_OTHER): Payer: Medicare Other | Admitting: Licensed Clinical Social Worker

## 2019-03-29 DIAGNOSIS — F331 Major depressive disorder, recurrent, moderate: Secondary | ICD-10-CM

## 2019-03-29 NOTE — BH Specialist Note (Signed)
Integrated Behavioral Health via Telemedicine Video Visit  03/28/2019 Kara Munoz [id number]  Session Start time: 11:05am  Session End time: 11:40am Total time: 35 minutes  Type of Visit: Video [facility]Family [facility] Provider location: Counselor's home office All persons participating in visit: Counselor and patient  Confirmed patient's address: YES Confirmed patient's phone number: YES Any changes to demographics: NO  Confirmed patient's insurance: YES Any changes to patient's insurance: NO  Discussed confidentiality: YES  I connected with Kara Munoz by a video enabled telemedicine application and verified that I am speaking with the correct person using two identifiers.     I discussed the limitations of evaluation and management by telemedicine and the availability of in person appointments.  I discussed that the purpose of this visit is to provide behavioral health care while limiting exposure to the novel coronavirus.   Discussed there is a possibility of technology failure and discussed alternative modes of communication if that failure occurs.  I discussed that engaging in this video visit, they consent to the provision of behavioral healthcare and the services will be billed under their insurance.  Patient and/or legal guardian expressed understanding and consented to video visit: YES  PRESENTING CONCERNS: Patient and/or family reports the following symptoms/concerns: irritability, fatigue, anxiety Duration of problem: 1 week; Severity of problem: Moderate   STRENGTHS (Protective Factors/Coping Skills): Intelligent, Long-term recovery, Motivated to move forward  GOALS ADDRESSED: Patient will: 1.  Reduce symptoms of: depression  INTERVENTIONS: Interventions utilized:  CBT, Supportive Counseling   ASSESSMENT: Patient currently experiencing irritability, anxiety and fatigue. She reports multiple conflicts with her daughter's family  recently. Counselor processed the situation with her, and guided her to identify the roots of the irritability. Patient identified that she felt vulnerable due to their being more people at an event than she expected and disrespected by her daughter making comments about her wearing a mask. Following this, she became upset over daughter smoking marijuana around her godchildren, which threatens her being able to spend time with them in the future. Patient stated that her 65 year old great-granddaughter later had some bad behavior, and following this patient left the event. Since that time, great-granddaughter has tried to contact patient twice to ask about patient leaving and patient has not been calm enough to respond. Counselor guided patient to explore how the situations with her daughter built up and resulted in her negative response to the situation with great-granddaughter. Patient was able to identify how they were all related, and verbalized that she does not want to hurt the relationship with her great-granddaughter. Counselor and patient explored ways that patient can express her concerns to great-granddaughter without it being amplified by her anger toward daughter. Patient and counselor practiced ways to talk with great-granddaughter that get patient's points across without placing blame or accusing anyone. Counselor pointed out that patient has much more positive relationships with great-grandchildren than with her daughter or granddaughter, and that keeping her anger at them separate can preserve these positive relationships. Counselor emphasized that patient may have to adjust her expectations when around the whole family, as great-granddaughter will act different with her parents than when she is just with patient. Patient explored self-talk she can use to do this, and to guide her conversation with great-granddaughter.  Patient may benefit from ongoing monthly counseling  sessions.  PLAN: 1. Follow up with behavioral health clinician on : 04/27/19 @ 11am  I discussed the assessment and treatment plan with the patient. They were  provided an opportunity to ask questions and all were answered. They agreed with the plan and demonstrated an understanding of the instructions.   They were advised to call back or seek an in-person evaluation if the symptoms worsen or if the condition fails to improve as anticipated.  Kara Munoz

## 2019-08-09 ENCOUNTER — Encounter: Payer: Self-pay | Admitting: Internal Medicine

## 2019-08-09 ENCOUNTER — Ambulatory Visit (INDEPENDENT_AMBULATORY_CARE_PROVIDER_SITE_OTHER): Payer: Medicare Other | Admitting: Internal Medicine

## 2019-08-09 ENCOUNTER — Other Ambulatory Visit: Payer: Self-pay

## 2019-08-09 ENCOUNTER — Other Ambulatory Visit: Payer: Self-pay | Admitting: Internal Medicine

## 2019-08-09 DIAGNOSIS — F17200 Nicotine dependence, unspecified, uncomplicated: Secondary | ICD-10-CM | POA: Diagnosis not present

## 2019-08-09 DIAGNOSIS — B20 Human immunodeficiency virus [HIV] disease: Secondary | ICD-10-CM | POA: Diagnosis not present

## 2019-08-09 DIAGNOSIS — F331 Major depressive disorder, recurrent, moderate: Secondary | ICD-10-CM | POA: Diagnosis not present

## 2019-08-09 DIAGNOSIS — Z23 Encounter for immunization: Secondary | ICD-10-CM

## 2019-08-09 NOTE — Assessment & Plan Note (Signed)
Her chronic depression is improved but she is suffering a mild to moderate grief reaction following the death of her little sister.  We will get her back in touch with our behavioral health counselor.

## 2019-08-09 NOTE — Assessment & Plan Note (Signed)
Her infection has been under excellent, long-term control.  She will get lab work today, continue [place]o and follow-up in 6 months.  She received her influenza vaccine today.  She will get her second dose of Shingrix at the drugstore.

## 2019-08-09 NOTE — Assessment & Plan Note (Signed)
I talked to her again about the importance of complete cigarette cessation and asked her to consider setting a quit date

## 2019-08-09 NOTE — Progress Notes (Signed)
Patient Active Problem List   Diagnosis Date Noted  . Human immunodeficiency virus (HIV) disease (HCC) 07/23/2006    Priority: High  . DYSLIPIDEMIA 02/19/2010    Priority: Medium  . HYPERTENSION NEC 12/25/2009    Priority: Medium  . CIGARETTE SMOKER 05/09/2008    Priority: Medium  . ANXIETY 07/23/2006    Priority: Medium  . Polysubstance abuse (HCC) 07/23/2006    Priority: Medium  . Depression 07/23/2006    Priority: Medium  . DYSPLASIA, CERVIX NOS 07/23/2006    Priority: Medium  . GLAUCOMA 05/09/2008  . APPENDECTOMY, HX OF 12/02/2006  . GENITAL HERPES 07/23/2006  . 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 WARTS, OTHER SPECIFIED VIRAL 07/23/2006  . SYPHILIS 07/23/2006  . ANEMIA-NOS 07/23/2006  . PNEUMONIA, RECURRENT 07/23/2006  . DENTAL CARIES 07/23/2006    Patient's Medications  New Prescriptions   No medications on file  Previous Medications   B COMPLEX VITAMINS (VITAMIN-B COMPLEX) TABS    Take by mouth.   BIMATOPROST (LUMIGAN) 0.01 % SOLN    Place 1 drop into both eyes at bedtime.   BIOTIN 1 MG CAPS    Take by mouth.   CALCIUM CARBONATE-VITAMIN D (OYSTER SHELL CALCIUM/D) 250-125 MG-UNIT TABS    Take 1 tablet by mouth.   ECONAZOLE NITRATE 1 % CREAM    Apply topically.   FISH OIL-OMEGA-3 FATTY ACIDS 1000 MG CAPSULE    Take 1 g by mouth daily.   FLUTICASONE (CUTIVATE) 0.005 % OINTMENT    APPLY TO AFFECTED AREA once daily AS NEEDED   FLUTICASONE (FLONASE) 50 MCG/ACT NASAL SPRAY    Place into the nose.   HYDROCORTISONE (PROCTOZONE-HC) 2.5 % RECTAL CREAM    place rectally TWO TIMES DAILY for 10 days   MOMETASONE (NASONEX) 50 MCG/ACT NASAL SPRAY       MULTIPLE VITAMIN (THERA) TABS    Take 1 tablet by mouth.   NAPROXEN (NAPROSYN) 375 MG TABLET    TAKE ONE TABLET BY MOUTH TWICE DAILY with meals   NUTRITIONAL SUPPLEMENT LIQD    Take 1 Can by mouth 2 (two) times daily. Boost supplement, chocolate flavor   ONDANSETRON (ZOFRAN-ODT) 4 MG DISINTEGRATING TABLET     dissolve one tablet by mouth every 8 hours AS NEEDED for up to 5 days for nausea   PAZEO 0.7 % SOLN    INSTILL 1 DROP IN BOTH EYES DAILY IN THE MORNING.   PRAVASTATIN (PRAVACHOL) 40 MG TABLET       PSEUDOEPHEDRINE (SUDAFED) 30 MG TABLET    Take 30 mg by mouth.   SILVER SULFADIAZINE (SILVADENE) 1 % CREAM    Apply topically 2 (two) times daily.   SYMTUZA 800-150-200-10 MG TABS    take 1 TABLET BY MOUTH daily with breakfast   TRIAMCINOLONE CREAM (KENALOG) 0.1 %    apply TWO TIMES DAILY   VALACYCLOVIR (VALTREX) 1000 MG TABLET    Take 1/2 tablet (500 mg total) by mouth 2 (two) times daily. for 5 days as needed.   VITAMIN E 200 UNIT CAPSULE    Take by mouth.  Modified Medications   No medications on file  Discontinued Medications   ACYCLOVIR CREAM (ZOVIRAX) 5 %    Apply topically.   ALBUTEROL (PROAIR HFA) 108 (90 BASE) MCG/ACT INHALER    Inhale into the lungs.   CETIRIZINE (ZYRTEC) 10 MG TABLET    Take by mouth.   DOCUSATE (COLACE) 50 MG/5ML LIQUID  Take by mouth.   NORTRIPTYLINE (PAMELOR) 10 MG CAPSULE    Take 1 capsule (10 mg total) by mouth at bedtime.    Subjective: Kara Munoz is in for her routine HIV follow-up visit.  She has not had any problems obtaining, taking or tolerating her Symtuza.  She takes it each evening around 11 PM before bedtime.  She does not recall missing any doses.  She continues to smoke cigarettes but has cut down dramatically.  She says that she still smokes 1 cigarette after breakfast each day.  She has no current plans to quit.  She is feeling less depressed.  She is getting along better with her daughter and granddaughter but recently lost her younger sister who died of a pulmonary embolus.  Review of Systems: Review of Systems  Constitutional: Negative for chills, diaphoresis, fever, malaise/fatigue and weight loss.  HENT: Negative for sore throat.   Respiratory: Negative for cough, sputum production and shortness of breath.   Cardiovascular: Negative for chest  pain.  Gastrointestinal: Negative for abdominal pain, diarrhea, heartburn, nausea and vomiting.  Genitourinary: Negative for dysuria and frequency.  Musculoskeletal: Negative for joint pain and myalgias.  Skin: Negative for rash.  Neurological: Negative for dizziness and headaches.  Psychiatric/Behavioral: Positive for depression. Negative for substance abuse and suicidal ideas. The patient is not nervous/anxious.     Past Medical History:  Diagnosis Date  . Depression   . HIV infection ([place])   . Hyperlipidemia     Social History   Tobacco Use  . Smoking status: Light Tobacco Smoker    Packs/day: 0.10    Years: 35.00    Pack years: 3.50    Types: Cigarettes  . Smokeless tobacco: Never Used  . Tobacco comment: 1 per day  Substance Use Topics  . Alcohol use: No    Alcohol/week: 0.0 standard drinks  . Drug use: No    Comment: recovering, no now    No family history on file.  Allergies  Allergen Reactions  . Amoxicillin-Pot Clavulanate Diarrhea    Severe - Pt request it be listed as an allergy    Health Maintenance  Topic Date Due  . TETANUS/TDAP  03/19/1973  . MAMMOGRAM  03/19/2004  . COLONOSCOPY  03/19/2004  . PAP SMEAR-Modifier  06/28/2012  . DEXA SCAN  03/20/2019  . INFLUENZA VACCINE  05/14/2019  . PNA vac Low Risk Adult (2 of 2 - PPSV23) 02/10/2020  . Hepatitis C Screening  Completed  . HIV Screening  Completed    Objective:  Vitals:   08/09/19 0934  BP: (!) 166/105  Pulse: 96  Temp: 98.8 F (37.1 C)  TempSrc: Oral  Weight: 136 lb (61.7 kg)   Body mass index is 21.62 kg/m.  Physical Exam Constitutional:      Comments: She is talkative and in good spirits.  HENT:     Mouth/Throat:     Pharynx: No oropharyngeal exudate.  Eyes:     Conjunctiva/sclera: Conjunctivae normal.  Cardiovascular:     Rate and Rhythm: Normal rate and regular rhythm.     Heart sounds: No murmur.  Pulmonary:     Effort: Pulmonary effort is normal.     Breath sounds:  Normal breath sounds.  Abdominal:     Palpations: Abdomen is soft. There is no mass.     Tenderness: There is no abdominal tenderness.  Musculoskeletal: Normal range of motion.  Skin:    Findings: No rash.  Neurological:     Mental Status: She  is alert and oriented to person, place, and time.  Psychiatric:        Mood and Affect: Mood normal.     Lab Results Lab Results  Component Value Date   WBC 8.5 02/10/2019   HGB 14.2 02/10/2019   HCT 43.5 02/10/2019   MCV 86.8 02/10/2019   PLT 244 02/10/2019    Lab Results  Component Value Date   CREATININE 1.11 (H) 02/10/2019   BUN 14 02/10/2019   NA 144 02/10/2019   K 4.0 02/10/2019   CL 107 02/10/2019   CO2 33 (H) 02/10/2019    Lab Results  Component Value Date   ALT 18 02/10/2019   AST 18 02/10/2019   ALKPHOS 84 03/26/2017   BILITOT 0.5 02/10/2019    Lab Results  Component Value Date   CHOL 266 (H) 02/10/2019   HDL 75 02/10/2019   LDLCALC 162 (H) 02/10/2019   TRIG 144 02/10/2019   CHOLHDL 3.5 02/10/2019   Lab Results  Component Value Date   LABRPR NON-REACTIVE 02/10/2019   HIV 1 RNA Quant (copies/mL)  Date Value  02/10/2019 <20 NOT DETECTED  05/27/2018 <20 DETECTED (A)  04/21/2018 137 (H)   CD4 T Cell Abs (/uL)  Date Value  02/10/2019 698  04/21/2018 500  03/30/2018 860     Problem List Items Addressed This Visit      High   Human immunodeficiency virus (HIV) disease (HCC)    Her infection has been under excellent, long-term control.  She will get lab work today, continue Symtuza and follow-up in 6 months.  She received her influenza vaccine today.  She will get her second dose of Shingrix at the drugstore.      Relevant Orders   T-helper cell (CD4)- ([facility] clinic only)   HIV-1 RNA quant-no reflex-bld     Medium   Depression    Her chronic depression is improved but she is suffering a mild to moderate grief reaction following the death of her little sister.  We will get her back in touch with our  behavioral health counselor.      CIGARETTE SMOKER    I talked to her again about the importance of complete cigarette cessation and asked her to consider setting a quit date           Cliffton Asters, MD [facility] for Infectious Disease [facility] [phone] pager   [phone] cell 08/09/2019, 9:53 AM

## 2019-08-10 LAB — T-HELPER CELL (CD4) - (RCID CLINIC ONLY)
CD4 % Helper T Cell: 32 % — ABNORMAL LOW (ref 33–65)
CD4 T Cell Abs: 794 /uL (ref 400–1790)

## 2019-08-11 ENCOUNTER — Other Ambulatory Visit: Payer: Self-pay | Admitting: Pharmacist

## 2019-08-11 ENCOUNTER — Other Ambulatory Visit: Payer: Self-pay | Admitting: *Deleted

## 2019-08-11 DIAGNOSIS — Z23 Encounter for immunization: Secondary | ICD-10-CM

## 2019-08-11 MED ORDER — ZOSTER VAC RECOMB ADJUVANTED 50 MCG/0.5ML IM SUSR: 0.5000 mL | Freq: Once | INTRAMUSCULAR | 0 refills | Status: AC

## 2019-08-14 LAB — HIV-1 RNA QUANT-NO REFLEX-BLD
HIV 1 RNA Quant: <20 copies/mL
HIV-1 RNA Quant, Log: <1.3 Log copies/mL

## 2019-09-20 ENCOUNTER — Other Ambulatory Visit: Payer: Self-pay | Admitting: Internal Medicine

## 2019-09-20 DIAGNOSIS — B20 Human immunodeficiency virus [HIV] disease: Secondary | ICD-10-CM

## 2019-10-12 ENCOUNTER — Telehealth: Payer: Self-pay | Admitting: *Deleted

## 2019-10-12 NOTE — Telephone Encounter (Signed)
Patient called to request refill of nutritional supplements to be faxed to her case manager Chavone/Jennifer at [employer] [phone], F [phone]). Landis Gandy, RN

## 2019-10-13 ENCOUNTER — Other Ambulatory Visit: Payer: Self-pay | Admitting: *Deleted

## 2019-10-13 DIAGNOSIS — B20 Human immunodeficiency virus [HIV] disease: Secondary | ICD-10-CM

## 2019-10-13 MED ORDER — NUTRITIONAL SUPPLEMENT PO LIQD: ORAL | 11 refills | Status: DC

## 2020-02-07 ENCOUNTER — Other Ambulatory Visit: Payer: Self-pay

## 2020-02-07 ENCOUNTER — Ambulatory Visit (INDEPENDENT_AMBULATORY_CARE_PROVIDER_SITE_OTHER): Payer: Medicare Other | Admitting: Internal Medicine

## 2020-02-07 ENCOUNTER — Encounter: Payer: Self-pay | Admitting: Internal Medicine

## 2020-02-07 DIAGNOSIS — T148XXA Other injury of unspecified body region, initial encounter: Secondary | ICD-10-CM | POA: Diagnosis not present

## 2020-02-07 DIAGNOSIS — F331 Major depressive disorder, recurrent, moderate: Secondary | ICD-10-CM

## 2020-02-07 DIAGNOSIS — B20 Human immunodeficiency virus [HIV] disease: Secondary | ICD-10-CM

## 2020-02-07 NOTE — Assessment & Plan Note (Signed)
We will check her CBC today.

## 2020-02-07 NOTE — Assessment & Plan Note (Signed)
Her infection has been under excellent, long-term control.  She will get repeat lab work today and continue [employer].  She will follow-up in 6 months.

## 2020-02-07 NOTE — Progress Notes (Signed)
Patient Active Problem List   Diagnosis Date Noted  . Human immunodeficiency virus (HIV) disease (HCC) 07/23/2006    Priority: High  . DYSLIPIDEMIA 02/19/2010    Priority: Medium  . HYPERTENSION NEC 12/25/2009    Priority: Medium  . CIGARETTE SMOKER 05/09/2008    Priority: Medium  . ANXIETY 07/23/2006    Priority: Medium  . Polysubstance abuse (HCC) 07/23/2006    Priority: Medium  . Depression 07/23/2006    Priority: Medium  . DYSPLASIA, CERVIX NOS 07/23/2006    Priority: Medium  . Bruising 02/07/2020  . GLAUCOMA 05/09/2008  . APPENDECTOMY, HX OF 12/02/2006  . GENITAL HERPES 07/23/2006  . 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 WARTS, OTHER SPECIFIED VIRAL 07/23/2006  . SYPHILIS 07/23/2006  . ANEMIA-NOS 07/23/2006  . PNEUMONIA, RECURRENT 07/23/2006  . DENTAL CARIES 07/23/2006    Patient's Medications  New Prescriptions   No medications on file  Previous Medications   AMLODIPINE (NORVASC) 5 MG TABLET    Take by mouth.   B COMPLEX VITAMINS (VITAMIN-B COMPLEX) TABS    Take by mouth.   BIMATOPROST (LUMIGAN) 0.01 % SOLN    Place 1 drop into both eyes at bedtime.   BIOTIN 1 MG CAPS    Take by mouth.   CALCIUM CARBONATE-VITAMIN D (OYSTER SHELL CALCIUM/D) 250-125 MG-UNIT TABS    Take 1 tablet by mouth.   ECONAZOLE NITRATE 1 % CREAM    Apply topically.   FISH OIL-OMEGA-3 FATTY ACIDS 1000 MG CAPSULE    Take 1 g by mouth daily.   FLUTICASONE (CUTIVATE) 0.005 % OINTMENT    APPLY TO AFFECTED AREA once daily AS NEEDED   FLUTICASONE (FLONASE) 50 MCG/ACT NASAL SPRAY    Place into the nose.   HYDROCORTISONE (PROCTOZONE-HC) 2.5 % RECTAL CREAM    place rectally TWO TIMES DAILY for 10 days   MOMETASONE (NASONEX) 50 MCG/ACT NASAL SPRAY       MULTIPLE VITAMIN (THERA) TABS    Take 1 tablet by mouth.   NAPROXEN (NAPROSYN) 375 MG TABLET    TAKE ONE TABLET BY MOUTH TWICE DAILY with meals   NUTRITIONAL SUPPLEMENT LIQD    Take 1 can by mouth 2 (two) times daily. Boost supplement,  chocolate flavor   ONDANSETRON (ZOFRAN-ODT) 4 MG DISINTEGRATING TABLET    dissolve one tablet by mouth every 8 hours AS NEEDED for up to 5 days for nausea   PAZEO 0.7 % SOLN    INSTILL 1 DROP IN BOTH EYES DAILY IN THE MORNING.   PRAVASTATIN (PRAVACHOL) 40 MG TABLET       PSEUDOEPHEDRINE (SUDAFED) 30 MG TABLET    Take 30 mg by mouth.   SILVER SULFADIAZINE (SILVADENE) 1 % CREAM    Apply topically 2 (two) times daily.   SYMTUZA 800-150-200-10 MG TABS    take 1 TABLET BY MOUTH daily with breakfast   TRIAMCINOLONE CREAM (KENALOG) 0.1 %    apply TWO TIMES DAILY   VALACYCLOVIR (VALTREX) 1000 MG TABLET    Take 1/2 tablet (500 mg total) by mouth 2 (two) times daily. for 5 days as needed.   VITAMIN E 200 UNIT CAPSULE    Take by mouth.  Modified Medications   No medications on file  Discontinued Medications   No medications on file    Subjective: Kara Munoz is in for her routine HIV follow-up visit.  She has not had any problems obtaining, taking or tolerating her Symtuza.  She has  recently noted some bruising on her forearms and some swelling above her clavicles bilaterally.  She is not having any pain.  She has received the maternal Covid vaccine without difficulty.  She would like to meet with our behavioral health counselor if possible.  Review of Systems: Review of Systems  Constitutional: Negative for fever and weight loss.  HENT: Negative for congestion and sore throat.   Respiratory: Negative for cough and shortness of breath.   Cardiovascular: Negative for chest pain.  Gastrointestinal: Positive for constipation. Negative for abdominal pain, diarrhea, nausea and vomiting.  Psychiatric/Behavioral: Positive for depression.    Past Medical History:  Diagnosis Date  . Depression   . HIV infection ([place])   . Hyperlipidemia     Social History   Tobacco Use  . Smoking status: Light Tobacco Smoker    Packs/day: 0.10    Years: 35.00    Pack years: 3.50    Types: Cigarettes  . Smokeless  tobacco: Never Used  . Tobacco comment: 1 per day  Substance Use Topics  . Alcohol use: No    Alcohol/week: 0.0 standard drinks  . Drug use: No    Comment: recovering, no now    No family history on file.  Allergies  Allergen Reactions  . Amoxicillin-Pot Clavulanate Diarrhea    Severe - Pt request it be listed as an allergy    Health Maintenance  Topic Date Due  . COVID-19 Vaccine (1) Never done  . TETANUS/TDAP  Never done  . MAMMOGRAM  Never done  . COLONOSCOPY  Never done  . PAP SMEAR-Modifier  06/28/2012  . DEXA SCAN  Never done  . PNA vac Low Risk Adult (2 of 2 - PPSV23) 02/10/2020  . INFLUENZA VACCINE  05/13/2020  . Hepatitis C Screening  Completed  . HIV Screening  Completed    Objective:  Vitals:   02/07/20 0954  BP: 119/76  Pulse: 80  Temp: 98.2 F (36.8 C)  SpO2: 95%  Weight: 139 lb (63 kg)  Height: 5' 6.5" (1.689 m)   Body mass index is 22.1 kg/m.  Physical Exam Constitutional:      Comments: She is talkative and in good spirits as usual.  Cardiovascular:     Rate and Rhythm: Normal rate.     Heart sounds: No murmur.  Pulmonary:     Effort: Pulmonary effort is normal.     Breath sounds: Normal breath sounds.  Chest:    Skin:    Comments: She does have scattered bruises on her forearms.  Neurological:     General: No focal deficit present.     Lab Results Lab Results  Component Value Date   WBC 8.5 02/10/2019   HGB 14.2 02/10/2019   HCT 43.5 02/10/2019   MCV 86.8 02/10/2019   PLT 244 02/10/2019    Lab Results  Component Value Date   CREATININE 1.11 (H) 02/10/2019   BUN 14 02/10/2019   NA 144 02/10/2019   K 4.0 02/10/2019   CL 107 02/10/2019   CO2 33 (H) 02/10/2019    Lab Results  Component Value Date   ALT 18 02/10/2019   AST 18 02/10/2019   ALKPHOS 84 03/26/2017   BILITOT 0.5 02/10/2019    Lab Results  Component Value Date   CHOL 266 (H) 02/10/2019   HDL 75 02/10/2019   LDLCALC 162 (H) 02/10/2019   TRIG 144  02/10/2019   CHOLHDL 3.5 02/10/2019   Lab Results  Component Value Date  LABRPR NON-REACTIVE 02/10/2019   HIV 1 RNA Quant (copies/mL)  Date Value  08/09/2019 <20 NOT DETECTED  02/10/2019 <20 NOT DETECTED  05/27/2018 <20 DETECTED (A)   CD4 T Cell Abs (/uL)  Date Value  08/09/2019 794  02/10/2019 698  04/21/2018 500     Problem List Items Addressed This Visit      High   Human immunodeficiency virus (HIV) disease (HCC)    Her infection has been under excellent, long-term control.  She will get repeat lab work today and continue [employer].  She will follow-up in 6 months.      Relevant Orders   T-helper cell (CD4)- ([facility] clinic only)   HIV-1 RNA quant-no reflex-bld   CBC   Comprehensive metabolic panel   RPR   Lipid panel     Medium   Depression    I will arrange a visit with our behavioral health counselor.        Unprioritized   Bruising    We will check her CBC today.           Cliffton Asters, MD [facility] for Infectious Disease [facility] [phone] pager   [phone] cell 02/07/2020, 10:13 AM

## 2020-02-07 NOTE — Assessment & Plan Note (Signed)
I will arrange a visit with our behavioral health counselor. 

## 2020-02-08 LAB — T-HELPER CELL (CD4) - (RCID CLINIC ONLY)
CD4 % Helper T Cell: 30 % — ABNORMAL LOW (ref 33–65)
CD4 T Cell Abs: 696 /uL (ref 400–1790)

## 2020-02-10 LAB — COMPREHENSIVE METABOLIC PANEL
AG Ratio: 1.6 (calc) (ref 1.0–2.5)
ALT: 17 U/L (ref 6–29)
AST: 17 U/L (ref 10–35)
Albumin: 4 g/dL (ref 3.6–5.1)
Alkaline phosphatase (APISO): 86 U/L (ref 37–153)
BUN/Creatinine Ratio: 13 (calc) (ref 6–22)
BUN: 15 mg/dL (ref 7–25)
CO2: 30 mmol/L (ref 20–32)
Calcium: 10.6 mg/dL — ABNORMAL HIGH (ref 8.6–10.4)
Chloride: 107 mmol/L (ref 98–110)
Creat: 1.18 mg/dL — ABNORMAL HIGH (ref 0.50–0.99)
Globulin: 2.5 g/dL (calc) (ref 1.9–3.7)
Glucose, Bld: 61 mg/dL — ABNORMAL LOW (ref 65–99)
Potassium: 3.9 mmol/L (ref 3.5–5.3)
Sodium: 143 mmol/L (ref 135–146)
Total Bilirubin: 0.5 mg/dL (ref 0.2–1.2)
Total Protein: 6.5 g/dL (ref 6.1–8.1)

## 2020-02-10 LAB — LIPID PANEL
Cholesterol: 258 mg/dL — ABNORMAL HIGH (ref <200)
HDL: 74 mg/dL (ref >=50)
LDL Cholesterol (Calc): 156 mg/dL (calc) — ABNORMAL HIGH
Non-HDL Cholesterol (Calc): 184 mg/dL (calc) — ABNORMAL HIGH (ref <130)
Total CHOL/HDL Ratio: 3.5 (calc) (ref <5.0)
Triglycerides: 146 mg/dL (ref <150)

## 2020-02-10 LAB — CBC
HCT: 43.2 % (ref 35.0–45.0)
Hemoglobin: 14.2 g/dL (ref 11.7–15.5)
MCH: 28.2 pg (ref 27.0–33.0)
MCHC: 32.9 g/dL (ref 32.0–36.0)
MCV: 85.9 fL (ref 80.0–100.0)
MPV: 11 fL (ref 7.5–12.5)
Platelets: 268 10*3/uL (ref 140–400)
RBC: 5.03 10*6/uL (ref 3.80–5.10)
RDW: 13.7 % (ref 11.0–15.0)
WBC: 7.4 10*3/uL (ref 3.8–10.8)

## 2020-02-10 LAB — RPR: RPR Ser Ql: NONREACTIVE

## 2020-02-10 LAB — HIV-1 RNA QUANT-NO REFLEX-BLD
HIV 1 RNA Quant: 27 copies/mL — ABNORMAL HIGH
HIV-1 RNA Quant, Log: 1.43 Log copies/mL — ABNORMAL HIGH

## 2020-04-02 ENCOUNTER — Other Ambulatory Visit: Payer: Self-pay | Admitting: Internal Medicine

## 2020-04-02 DIAGNOSIS — B20 Human immunodeficiency virus [HIV] disease: Secondary | ICD-10-CM

## 2020-05-25 ENCOUNTER — Telehealth: Payer: Self-pay | Admitting: Infectious Diseases

## 2020-05-25 NOTE — Telephone Encounter (Signed)
Returned call to Dr. Antony Salmon with [facility] - she saw this patient recently and following for elevated ANA of undetermined significance. Presented today with b/l hand swelling and b/l lower extremity edema with 1+ proteinuria. ? If related to Symtuza.   On chart review she has had well controlled HIV since 2016 (and likely prior to that). SCr 1.13 - 1.32 with no other proteinuria on other urine samples I can find. Given clinical symptoms I doubt this is related to [facility] and agreed that nephrology referral would be warranted for consideration of biopsy given unexplained autoimmune process.   Invited Dr. Orvan Falconer to call her should he have any further thoughts or concerns.    Rexene Alberts, MSN, NP-C [facility] for Infectious Disease [facility]  [email or web address] Pager: [phone] Office: [phone] [facility] Main Line: [phone]

## 2020-05-28 ENCOUNTER — Other Ambulatory Visit: Payer: Self-pay | Admitting: Internal Medicine

## 2020-05-28 DIAGNOSIS — N051 Unspecified nephritic syndrome with focal and segmental glomerular lesions: Secondary | ICD-10-CM | POA: Insufficient documentation

## 2020-05-28 DIAGNOSIS — R809 Proteinuria, unspecified: Secondary | ICD-10-CM | POA: Insufficient documentation

## 2020-05-28 NOTE — Telephone Encounter (Signed)
Patient called office today requesting to speak with Dr. Orvan Falconer regarding lab results from Rheumatology. States that she was told Friday she will be referred to nephrology. Would like to know if Dr. Orvan Falconer had any recommendations on a provider for her.  Lorenso Courier, [place]

## 2020-05-28 NOTE — Telephone Encounter (Signed)
I called and spoke with Dois Davenport today.  She says that she would like to be seen by the nephrology group.  [place].  I will make copies of Dr. Blair Heys note and lab results and make a referral to [facility].

## 2020-06-05 ENCOUNTER — Telehealth: Payer: Self-pay

## 2020-06-05 NOTE — Telephone Encounter (Signed)
Patient called office today to follow up on referral for [facility]. States she has not heard from anyone regarding an appointment; has called office and was told referral is under review.  Patient requesting MD call to have appointment scheduled now. Informed patient that referral coordinator has been working on this.  Lorenso Courier, [place]

## 2020-06-11 ENCOUNTER — Telehealth: Payer: Self-pay

## 2020-06-11 NOTE — Telephone Encounter (Signed)
Patient calling due to being referred to [facility] and patient has still has not been contacted by the office to schedule an appointment. Patient states she has been calling them and they have referral information and currently reviewing.  Patient just want [facility] to know that she still does not have an appointment yet after referral was placed on 05/28/20. Valarie Cones

## 2020-06-28 ENCOUNTER — Other Ambulatory Visit: Payer: Self-pay | Admitting: Nephrology

## 2020-06-28 DIAGNOSIS — N1831 Chronic kidney disease, stage 3a: Secondary | ICD-10-CM

## 2020-06-28 DIAGNOSIS — R809 Proteinuria, unspecified: Secondary | ICD-10-CM

## 2020-07-17 ENCOUNTER — Ambulatory Visit
Admission: RE | Admit: 2020-07-17 | Discharge: 2020-07-17 | Disposition: A | Discharge status: Home / Self Care | Payer: Medicare Other | Source: Ambulatory Visit | Attending: Nephrology | Admitting: Nephrology

## 2020-07-17 DIAGNOSIS — N1831 Chronic kidney disease, stage 3a: Secondary | ICD-10-CM

## 2020-07-17 DIAGNOSIS — R809 Proteinuria, unspecified: Secondary | ICD-10-CM

## 2020-08-03 ENCOUNTER — Other Ambulatory Visit (HOSPITAL_COMMUNITY): Payer: Self-pay | Admitting: Internal Medicine

## 2020-08-03 DIAGNOSIS — R809 Proteinuria, unspecified: Secondary | ICD-10-CM

## 2020-08-06 ENCOUNTER — Other Ambulatory Visit (HOSPITAL_COMMUNITY): Payer: Self-pay | Admitting: Internal Medicine

## 2020-08-06 DIAGNOSIS — I129 Hypertensive chronic kidney disease with stage 1 through stage 4 chronic kidney disease, or unspecified chronic kidney disease: Secondary | ICD-10-CM

## 2020-08-06 DIAGNOSIS — R809 Proteinuria, unspecified: Secondary | ICD-10-CM

## 2020-08-06 DIAGNOSIS — N1831 Chronic kidney disease, stage 3a: Secondary | ICD-10-CM

## 2020-08-06 DIAGNOSIS — Z720 Tobacco use: Secondary | ICD-10-CM

## 2020-08-08 ENCOUNTER — Ambulatory Visit (INDEPENDENT_AMBULATORY_CARE_PROVIDER_SITE_OTHER): Payer: Medicare Other | Admitting: Internal Medicine

## 2020-08-08 ENCOUNTER — Other Ambulatory Visit: Payer: Self-pay

## 2020-08-08 ENCOUNTER — Encounter: Payer: Self-pay | Admitting: Internal Medicine

## 2020-08-08 VITALS — BP 136/77 | HR 85 | Wt 139.0 lb

## 2020-08-08 DIAGNOSIS — B20 Human immunodeficiency virus [HIV] disease: Secondary | ICD-10-CM | POA: Diagnosis not present

## 2020-08-08 DIAGNOSIS — Z23 Encounter for immunization: Secondary | ICD-10-CM | POA: Diagnosis not present

## 2020-08-08 DIAGNOSIS — F331 Major depressive disorder, recurrent, moderate: Secondary | ICD-10-CM

## 2020-08-08 DIAGNOSIS — R809 Proteinuria, unspecified: Secondary | ICD-10-CM

## 2020-08-08 MED ORDER — SYMTUZA 800-150-200-10 MG PO TABS: 1.0000 | ORAL_TABLET | Freq: Every day | ORAL | 11 refills | Status: DC

## 2020-08-08 NOTE — Progress Notes (Signed)
Patient Active Problem List   Diagnosis Date Noted  . Proteinuria 05/28/2020    Priority: High  . Human immunodeficiency virus (HIV) disease (HCC) 07/23/2006    Priority: High  . DYSLIPIDEMIA 02/19/2010    Priority: Medium  . HYPERTENSION NEC 12/25/2009    Priority: Medium  . CIGARETTE SMOKER 05/09/2008    Priority: Medium  . ANXIETY 07/23/2006    Priority: Medium  . Polysubstance abuse (HCC) 07/23/2006    Priority: Medium  . Depression 07/23/2006    Priority: Medium  . DYSPLASIA, CERVIX NOS 07/23/2006    Priority: Medium  . Bruising 02/07/2020  . GLAUCOMA 05/09/2008  . APPENDECTOMY, HX OF 12/02/2006  . GENITAL HERPES 07/23/2006  . 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 WARTS, OTHER SPECIFIED VIRAL 07/23/2006  . SYPHILIS 07/23/2006  . ANEMIA-NOS 07/23/2006  . PNEUMONIA, RECURRENT 07/23/2006  . DENTAL CARIES 07/23/2006    Patient's Medications  New Prescriptions   No medications on file  Previous Medications   AMLODIPINE (NORVASC) 5 MG TABLET    Take by mouth.   B COMPLEX VITAMINS (VITAMIN-B COMPLEX) TABS    Take by mouth.   BIMATOPROST (LUMIGAN) 0.01 % SOLN    Place 1 drop into both eyes at bedtime.   BIOTIN 1 MG CAPS    Take by mouth.   CALCIUM CARBONATE-VITAMIN D (OYSTER SHELL CALCIUM/D) 250-125 MG-UNIT TABS    Take 1 tablet by mouth.   ECONAZOLE NITRATE 1 % CREAM    Apply topically.   FISH OIL-OMEGA-3 FATTY ACIDS 1000 MG CAPSULE    Take 1 g by mouth daily.   FLUTICASONE (CUTIVATE) 0.005 % OINTMENT    APPLY TO AFFECTED AREA once daily AS NEEDED   FLUTICASONE (FLONASE) 50 MCG/ACT NASAL SPRAY    Place into the nose.   HYDROCORTISONE (PROCTOZONE-HC) 2.5 % RECTAL CREAM    place rectally TWO TIMES DAILY for 10 days   MOMETASONE (NASONEX) 50 MCG/ACT NASAL SPRAY       MULTIPLE VITAMIN (THERA) TABS    Take 1 tablet by mouth.   NAPROXEN (NAPROSYN) 375 MG TABLET    TAKE ONE TABLET BY MOUTH TWICE DAILY with meals   NUTRITIONAL SUPPLEMENT LIQD    Take 1 can by  mouth 2 (two) times daily. Boost supplement, chocolate flavor   ONDANSETRON (ZOFRAN-ODT) 4 MG DISINTEGRATING TABLET    dissolve one tablet by mouth every 8 hours AS NEEDED for up to 5 days for nausea   PAZEO 0.7 % SOLN    INSTILL 1 DROP IN BOTH EYES DAILY IN THE MORNING.   PRAVASTATIN (PRAVACHOL) 40 MG TABLET       PSEUDOEPHEDRINE (SUDAFED) 30 MG TABLET    Take 30 mg by mouth.   SILVER SULFADIAZINE (SILVADENE) 1 % CREAM    Apply topically 2 (two) times daily.   TRIAMCINOLONE CREAM (KENALOG) 0.1 %    apply TWO TIMES DAILY   VALACYCLOVIR (VALTREX) 1000 MG TABLET    Take 1/2 tablet (500 mg total) by mouth 2 (two) times daily. for 5 days as needed.   VITAMIN E 200 UNIT CAPSULE    Take by mouth.  Modified Medications   Modified Medication Previous Medication   DARUNAVIR-COBICISCTAT-EMTRICITABINE-TENOFOVIR ALAFENAMIDE (SYMTUZA) 800-150-200-10 MG TABS SYMTUZA 800-150-200-10 MG TABS      Take 1 tablet by mouth daily with breakfast.    take 1 TABLET BY MOUTH daily with breakfast  Discontinued Medications   No medications on file  Subjective: Kara Munoz is in for her routine HIV follow-up visit.  She denies any problems obtaining, taking or tolerating her Symtuza.  She has not missed any doses.  She received her Moderna Covid vaccines in March and April.  She is scheduled for a kidney biopsy next week to evaluate her proteinuria and positive ANA.  She continues to struggle with depression.  She did not schedule a visit with our behavioral health counselor after her last visit here.  Review of Systems: Review of Systems  Constitutional: Negative for chills, diaphoresis, fever, malaise/fatigue and weight loss.  HENT: Negative for sore throat.   Respiratory: Negative for cough, sputum production and shortness of breath.   Cardiovascular: Negative for chest pain.  Gastrointestinal: Negative for abdominal pain, diarrhea, heartburn, nausea and vomiting.  Genitourinary: Negative for dysuria and frequency.    Musculoskeletal: Positive for joint pain. Negative for myalgias.  Skin: Negative for rash.  Neurological: Negative for dizziness and headaches.  Psychiatric/Behavioral: Positive for depression. Negative for substance abuse. The patient is not nervous/anxious.     Past Medical History:  Diagnosis Date  . Depression   . HIV infection (HCC)   . Hyperlipidemia     Social History   Tobacco Use  . Smoking status: Light Tobacco Smoker    Packs/day: 0.10    Years: 35.00    Pack years: 3.50    Types: Cigarettes  . Smokeless tobacco: Never Used  . Tobacco comment: 1 per day  Substance Use Topics  . Alcohol use: No    Alcohol/week: 0.0 standard drinks  . Drug use: No    Comment: recovering, no now    No family history on file.  Allergies  Allergen Reactions  . Amoxicillin-Pot Clavulanate Diarrhea    Severe - Pt request it be listed as an allergy    Health Maintenance  Topic Date Due  . COVID-19 Vaccine (1) Never done  . TETANUS/TDAP  Never done  . MAMMOGRAM  Never done  . COLONOSCOPY  Never done  . DEXA SCAN  Never done  . PNA vac Low Risk Adult (2 of 2 - PPSV23) 02/10/2020  . INFLUENZA VACCINE  05/13/2020  . Hepatitis C Screening  Completed    Objective:  Vitals:   08/08/20 1012  BP: 136/77  Pulse: 85  SpO2: 99%  Weight: 139 lb (63 kg)   Body mass index is 22.1 kg/m.  Physical Exam Constitutional:      Comments: She is talkative and in good spirits as usual.  Cardiovascular:     Rate and Rhythm: Normal rate and regular rhythm.     Heart sounds: No murmur heard.   Pulmonary:     Effort: Pulmonary effort is normal.     Breath sounds: Normal breath sounds.  Abdominal:     Palpations: Abdomen is soft.     Tenderness: There is no abdominal tenderness.  Musculoskeletal:        General: Tenderness present. No swelling.     Comments: She has some tenderness with palpation around her left hip.  Skin:    Findings: No rash.  Neurological:     General: No  focal deficit present.  Psychiatric:        Mood and Affect: Mood normal.     Lab Results Lab Results  Component Value Date   WBC 7.4 02/07/2020   HGB 14.2 02/07/2020   HCT 43.2 02/07/2020   MCV 85.9 02/07/2020   PLT 268 02/07/2020    Lab Results  Component Value Date   CREATININE 1.18 (H) 02/07/2020   BUN 15 02/07/2020   NA 143 02/07/2020   K 3.9 02/07/2020   CL 107 02/07/2020   CO2 30 02/07/2020    Lab Results  Component Value Date   ALT 17 02/07/2020   AST 17 02/07/2020   ALKPHOS 84 03/26/2017   BILITOT 0.5 02/07/2020    Lab Results  Component Value Date   CHOL 258 (H) 02/07/2020   HDL 74 02/07/2020   LDLCALC 156 (H) 02/07/2020   TRIG 146 02/07/2020   CHOLHDL 3.5 02/07/2020   Lab Results  Component Value Date   LABRPR NON-REACTIVE 02/07/2020   HIV 1 RNA Quant (copies/mL)  Date Value  02/07/2020 27 (H)  08/09/2019 <20 NOT DETECTED  02/10/2019 <20 NOT DETECTED   CD4 T Cell Abs (/uL)  Date Value  02/07/2020 696  08/09/2019 794  02/10/2019 698     Problem List Items Addressed This Visit      High   Proteinuria    I talked to her about what a kidney biopsy entails and encouraged her to go through with procedure.      Human immunodeficiency virus (HIV) disease (HCC)    Her infection has been under very good long-term control.  She will get lab work today, continue Symtuza and follow-up in 2 months.  She received her influenza vaccine here today.  Encouraged her to call her local pharmacy in [place] to get her [employer] booster.      Relevant Medications   Darunavir-Cobicisctat-Emtricitabine-Tenofovir Alafenamide (SYMTUZA) 800-150-200-10 MG TABS   Other Relevant Orders   T-helper cell (CD4)- ([facility] clinic only)   HIV-1 RNA quant-no reflex-bld     Medium   Depression    She is still bothered by her chronic depression.  She does not want to meet with our behavioral health counselor.  She is aware that she can schedule a visit at anytime in the  future.           Cliffton Asters, MD [facility] for Infectious Disease [facility] [phone] pager   [phone] cell 08/08/2020, 10:37 AM

## 2020-08-08 NOTE — Assessment & Plan Note (Signed)
Her infection has been under very good long-term control.  She will get lab work today, continue Symtuza and follow-up in 2 months.  She received her influenza vaccine here today.  Encouraged her to call her local pharmacy in [place] to get her [employer] booster.

## 2020-08-08 NOTE — Assessment & Plan Note (Signed)
She is still bothered by her chronic depression.  She does not want to meet with our behavioral health counselor.  She is aware that she can schedule a visit at anytime in the future.

## 2020-08-08 NOTE — Assessment & Plan Note (Signed)
I talked to her about what a kidney biopsy entails and encouraged her to go through with procedure.

## 2020-08-09 LAB — T-HELPER CELL (CD4) - (RCID CLINIC ONLY)
CD4 % Helper T Cell: 34 % (ref 33–65)
CD4 T Cell Abs: 679 /uL (ref 400–1790)

## 2020-08-13 LAB — HIV-1 RNA QUANT-NO REFLEX-BLD
HIV 1 RNA Quant: <20 Copies/mL
HIV-1 RNA Quant, Log: <1.3 Log cps/mL

## 2020-08-20 ENCOUNTER — Other Ambulatory Visit: Payer: Self-pay | Admitting: Radiology

## 2020-08-21 ENCOUNTER — Ambulatory Visit (HOSPITAL_COMMUNITY): Admission: RE | Admit: 2020-08-21 | Payer: Medicare Other | Source: Ambulatory Visit

## 2020-08-31 ENCOUNTER — Other Ambulatory Visit: Payer: Self-pay | Admitting: Student

## 2020-09-03 ENCOUNTER — Other Ambulatory Visit: Payer: Self-pay

## 2020-09-03 ENCOUNTER — Encounter (HOSPITAL_COMMUNITY): Payer: Self-pay

## 2020-09-03 ENCOUNTER — Ambulatory Visit (HOSPITAL_COMMUNITY)
Admission: RE | Admit: 2020-09-03 | Discharge: 2020-09-03 | Disposition: A | Discharge status: Home / Self Care | Payer: Medicare Other | Source: Ambulatory Visit | Attending: Internal Medicine | Admitting: Internal Medicine

## 2020-09-03 DIAGNOSIS — R809 Proteinuria, unspecified: Secondary | ICD-10-CM | POA: Diagnosis present

## 2020-09-03 DIAGNOSIS — F17210 Nicotine dependence, cigarettes, uncomplicated: Secondary | ICD-10-CM | POA: Insufficient documentation

## 2020-09-03 DIAGNOSIS — I129 Hypertensive chronic kidney disease with stage 1 through stage 4 chronic kidney disease, or unspecified chronic kidney disease: Secondary | ICD-10-CM | POA: Diagnosis not present

## 2020-09-03 DIAGNOSIS — Z720 Tobacco use: Secondary | ICD-10-CM

## 2020-09-03 DIAGNOSIS — Z90710 Acquired absence of both cervix and uterus: Secondary | ICD-10-CM | POA: Diagnosis not present

## 2020-09-03 DIAGNOSIS — N189 Chronic kidney disease, unspecified: Secondary | ICD-10-CM | POA: Insufficient documentation

## 2020-09-03 DIAGNOSIS — K742 Hepatic fibrosis with hepatic sclerosis: Secondary | ICD-10-CM | POA: Insufficient documentation

## 2020-09-03 DIAGNOSIS — N159 Renal tubulo-interstitial disease, unspecified: Secondary | ICD-10-CM | POA: Diagnosis not present

## 2020-09-03 DIAGNOSIS — N1831 Chronic kidney disease, stage 3a: Secondary | ICD-10-CM

## 2020-09-03 DIAGNOSIS — Z21 Asymptomatic human immunodeficiency virus [HIV] infection status: Secondary | ICD-10-CM | POA: Diagnosis not present

## 2020-09-03 DIAGNOSIS — Z79899 Other long term (current) drug therapy: Secondary | ICD-10-CM | POA: Insufficient documentation

## 2020-09-03 LAB — CBC
HCT: 42 % (ref 36.0–46.0)
Hemoglobin: 13.3 g/dL (ref 12.0–15.0)
MCH: 28.1 pg (ref 26.0–34.0)
MCHC: 31.7 g/dL (ref 30.0–36.0)
MCV: 88.8 fL (ref 80.0–100.0)
Platelets: 228 10*3/uL (ref 150–400)
RBC: 4.73 MIL/uL (ref 3.87–5.11)
RDW: 15.8 % — ABNORMAL HIGH (ref 11.5–15.5)
WBC: 4.8 10*3/uL (ref 4.0–10.5)
nRBC: 0 % (ref 0.0–0.2)

## 2020-09-03 LAB — PROTIME-INR
INR: 0.9 (ref 0.8–1.2)
Prothrombin Time: 12.1 seconds (ref 11.4–15.2)

## 2020-09-03 MED ORDER — SODIUM CHLORIDE 0.9 % IV SOLN: INTRAVENOUS | Status: DC

## 2020-09-03 MED ORDER — GELATIN ABSORBABLE 12-7 MM EX MISC
CUTANEOUS | Status: AC
  Filled 2020-09-03: qty 1

## 2020-09-03 MED ORDER — LIDOCAINE HCL (PF) 1 % IJ SOLN
INTRAMUSCULAR | Status: AC
  Filled 2020-09-03: qty 30

## 2020-09-03 MED ORDER — MIDAZOLAM HCL 2 MG/2ML IJ SOLN
INTRAMUSCULAR | Status: AC
  Filled 2020-09-03: qty 2

## 2020-09-03 MED ORDER — SODIUM CHLORIDE 0.9 % IV SOLN
INTRAVENOUS | Status: AC | PRN
  Administered 2020-09-03: 10 mL/h via INTRAVENOUS

## 2020-09-03 MED ORDER — FENTANYL CITRATE (PF) 100 MCG/2ML IJ SOLN
INTRAMUSCULAR | Status: AC
  Filled 2020-09-03: qty 2

## 2020-09-03 NOTE — Procedures (Signed)
Interventional Radiology Procedure Note  Procedure: US guided biopsy of left kidney, medical renal Complications: None EBL: None Recommendations: - Bedrest 2 hours.   - Routine wound care - Follow up pathology - Advance diet   Signed,  Brenlee Koskela, DO   

## 2020-09-03 NOTE — H&P (Signed)
Chief Complaint: Patient was seen in consultation today for random renal biopsy at the request of Peeples,Samuel J  Referring Physician(s): Peeples,Samuel J Dr Anthony Sar   Supervising Physician: Gilmer Mor  Patient Status: [facility] - Out-pt  History of Present Illness: Kara Munoz is a 64 y.o. female    CKD; proteinuria HTN; Hypercalcemia  Referred to Dr Thedore Mins Dr Valentino Nose per Dr Elio Forget per pt +HIV and follows with [facility] Disease Noted proteinuria in labs (looking back had noted in 2015 per pt)  Scheduled now for random renal biopsy  Past Medical History:  Diagnosis Date  . Depression   . HIV infection (HCC)   . Hyperlipidemia     Past Surgical History:  Procedure Laterality Date  . ABDOMINAL HYSTERECTOMY      Allergies: Amoxicillin-pot clavulanate  Medications: Prior to Admission medications   Medication Sig Start Date End Date Taking? Authorizing Provider  albuterol (VENTOLIN HFA) 108 (90 Base) MCG/ACT inhaler Inhale 2 puffs into the lungs every 4 (four) hours as needed. 07/02/20  Yes [provider]  bimatoprost (LUMIGAN) 0.01 % SOLN Place 1 drop into both eyes at bedtime.   Yes [provider]  Cholecalciferol (VITAMIN D3) 125 MCG (5000 UT) CHEW Chew 5,000 Units by mouth daily.   Yes [provider]  Darunavir-Cobicisctat-Emtricitabine-Tenofovir Alafenamide (SYMTUZA) 800-150-200-10 MG TABS Take 1 tablet by mouth daily with breakfast. 08/08/20  Yes Cliffton Asters, MD  ferrous sulfate 325 (65 FE) MG tablet Take 325 mg by mouth daily with breakfast.   Yes [provider]  fish oil-omega-3 fatty acids 1000 MG capsule Take 1 g by mouth daily.   Yes [provider]  Fluocinolone Acetonide Body 0.01 % OIL Apply 1 application topically 2 (two) times a week. 07/31/20  Yes [provider]  hydrocortisone (PROCTOZONE-HC) 2.5 % rectal cream Place 1 application rectally daily as needed for hemorrhoids or anal  itching.  12/14/17  Yes [provider]  Multiple Vitamins-Minerals (MULTIVITAMIN WITH MINERALS) tablet Take 1 tablet by mouth daily.   Yes [provider]  omega-3 acid ethyl esters (LOVAZA) 1 g capsule Take 1 g by mouth daily.   Yes [provider]  PAZEO 0.7 % SOLN Place 1 drop into both eyes daily.  02/29/16  Yes [provider]  pravastatin (PRAVACHOL) 40 MG tablet Take 40 mg by mouth daily.  12/25/15  Yes [provider]  silver sulfADIAZINE (SILVADENE) 1 % cream Apply 1 application topically daily as needed (itching).  03/07/14  Yes [provider]  telmisartan (MICARDIS) 20 MG tablet Take 20 mg by mouth daily. 06/22/20  Yes [provider]  Turmeric 500 MG CAPS Take 1,000 mg by mouth daily.   Yes [provider]  valACYclovir (VALTREX) 1000 MG tablet Take 1/2 tablet (500 mg total) by mouth 2 (two) times daily. for 5 days as needed. Patient taking differently: Take 500 mg by mouth daily.  08/28/15  Yes Cliffton Asters, MD  fluticasone [facility]) 50 MCG/ACT nasal spray Place 1 spray into both nostrils daily as needed for rhinitis.  01/15/18 08/16/20  [provider]     History reviewed. No pertinent family history.  Social History   Socioeconomic History  . Marital status: Divorced    Spouse name: Not on file  . Number of children: Not on file  . Years of education: Not on file  . Highest education level: Not on file  Occupational History  . Not on file  Tobacco Use  .  Smoking status: Light Tobacco Smoker    Packs/day: 0.10    Years: 35.00    Pack years: 3.50    Types: Cigarettes  . Smokeless tobacco: Never Used  . Tobacco comment: 1 per day  Substance and Sexual Activity  . Alcohol use: No    Alcohol/week: 0.0 standard drinks  . Drug use: No    Comment: recovering, no now  . Sexual activity: Not Currently    Partners: Male    Comment: given condoms for grandchild  Other Topics Concern  . Not on  file  Social History Narrative  . Not on file   Social Determinants of Health   Financial Resource Strain:   . Difficulty of Paying Living Expenses: Not on file  Food Insecurity:   . Worried About Programme researcher, broadcasting/film/video in the Last Year: Not on file  . Ran Out of Food in the Last Year: Not on file  Transportation Needs:   . Lack of Transportation (Medical): Not on file  . Lack of Transportation (Non-Medical): Not on file  Physical Activity:   . Days of Exercise per Week: Not on file  . Minutes of Exercise per Session: Not on file  Stress:   . Feeling of Stress : Not on file  Social Connections:   . Frequency of Communication with Friends and Family: Not on file  . Frequency of Social Gatherings with Friends and Family: Not on file  . Attends Religious Services: Not on file  . Active Member of Clubs or Organizations: Not on file  . Attends Banker Meetings: Not on file  . Marital Status: Not on file    Review of Systems: A 12 point ROS discussed and pertinent positives are indicated in the HPI above.  All other systems are negative.  Review of Systems  Constitutional: Negative for activity change, fatigue and fever.  Respiratory: Negative for cough and shortness of breath.   Cardiovascular: Negative for chest pain.  Gastrointestinal: Negative for abdominal pain.  Musculoskeletal: Negative for back pain and gait problem.  Neurological: Negative for weakness.  Psychiatric/Behavioral: Negative for behavioral problems and confusion.    Vital Signs: BP 114/84   Pulse 77   Temp 98.3 F (36.8 C) (Oral)   Ht 5' 6.5" (1.689 m)   Wt 138 lb (62.6 kg)   SpO2 98%   BMI 21.94 kg/m   Physical Exam Vitals reviewed.  HENT:     Mouth/Throat:     Mouth: Mucous membranes are moist.  Cardiovascular:     Rate and Rhythm: Normal rate and regular rhythm.     Heart sounds: Normal heart sounds.  Pulmonary:     Effort: Pulmonary effort is normal.     Breath sounds: Normal  breath sounds.  Abdominal:     Palpations: Abdomen is soft.  Musculoskeletal:        General: No swelling. Normal range of motion.     Right lower leg: No edema.     Left lower leg: No edema.  Skin:    General: Skin is warm.  Neurological:     Mental Status: She is alert and oriented to person, place, and time.  Psychiatric:        Behavior: Behavior normal.     Imaging: No results found.  Labs:  CBC: Recent Labs    02/07/20 1017 09/03/20 0641  WBC 7.4 4.8  HGB 14.2 13.3  HCT 43.2 42.0  PLT 268 228    COAGS: Recent Labs  09/03/20 0641  INR 0.9    BMP: Recent Labs    02/07/20 1017  NA 143  K 3.9  CL 107  CO2 30  GLUCOSE 61*  BUN 15  CALCIUM 10.6*  CREATININE 1.18*    LIVER FUNCTION TESTS: Recent Labs    02/07/20 1017  BILITOT 0.5  AST 17  ALT 17  PROT 6.5    TUMOR MARKERS: No results for input(s): AFPTM, CEA, CA199, CHROMGRNA in the last 8760 hours.  Assessment and Plan:  Known HIV- follows with ID Persistent proteinuria; CKD Referred to Nephrology-- Scheduled now for random renal biopsy Risks and benefits of random renal bx was discussed with the patient and/or patient's family including, but not limited to bleeding, infection, damage to adjacent structures or low yield requiring additional tests.  All of the questions were answered and there is agreement to proceed. Consent signed and in chart.  Thank you for this interesting consult.  I greatly enjoyed meeting Kara Munoz and look forward to participating in their care.  A copy of this report was sent to the requesting provider on this date.  Electronically Signed: Robet Leu, PA-C 09/03/2020, 7:19 AM   I spent a total of  30 Minutes   in face to face in clinical consultation, greater than 50% of which was counseling/coordinating care for random renal bx

## 2020-09-03 NOTE — Discharge Instructions (Signed)
Percutaneous Kidney Biopsy, Care After This sheet gives you information about how to care for yourself after your procedure. Your health care provider may also give you more specific instructions. If you have problems or questions, contact your health care provider. What can I expect after the procedure? After the procedure, it is common to have:  Pain or soreness near the biopsy site.  Pink or cloudy urine for 24 hours after the procedure. Follow these instructions at home: Activity  Return to your normal activities as told by your health care provider. Ask your health care provider what activities are safe for you.  If you were given a sedative during the procedure, it can affect you for several hours. Do not drive or operate machinery until your health care provider says that it is safe.  Do not lift anything that is heavier than 10 lb (4.5 kg), or the limit that you are told, until your health care provider says that it is safe.  Avoid activities that take a lot of effort (are strenuous) until your health care provider approves. Most people will have to wait 2 weeks before returning to activities such as exercise or sex. General instructions   Take over-the-counter and prescription medicines only as told by your health care provider.  You may eat and drink after your procedure. Follow instructions from your health care provider about eating or drinking restrictions.  Check your biopsy site every day for signs of infection. Check for: ? More redness, swelling, or pain. ? Fluid or blood. ? Warmth. ? Pus or a bad smell.  Keep all follow-up visits as told by your health care provider. This is important. Contact a health care provider if:  You have more redness, swelling, or pain around your biopsy site.  You have fluid or blood coming from your biopsy site.  Your biopsy site feels warm to the touch.  You have pus or a bad smell coming from your biopsy site.  You have blood  in your urine more than 24 hours after your procedure.  You have a fever. Get help right away if:  Your urine is dark red or brown.  You cannot urinate.  It burns when you urinate.  You feel dizzy or light-headed.  You have severe pain in your abdomen or side. Summary  After the procedure, it is common to have pain or soreness at the biopsy site and pink or cloudy urine for the first 24 hours.  Check your biopsy site each day for signs of infection, such as more redness, swelling, or pain; fluid, blood, pus or a bad smell coming from the biopsy site; or the biopsy site feeling warm to touch.  Return to your normal activities as told by your health care provider. This information is not intended to replace advice given to you by your health care provider. Make sure you discuss any questions you have with your health care provider. Document Revised: 06/02/2019 Document Reviewed: 06/02/2019 Elsevier Patient Education  2020 [employer]  

## 2020-10-04 ENCOUNTER — Encounter: Payer: Self-pay | Admitting: Internal Medicine

## 2020-12-26 ENCOUNTER — Encounter (HOSPITAL_COMMUNITY): Payer: Self-pay | Admitting: Internal Medicine

## 2020-12-26 LAB — SURGICAL PATHOLOGY

## 2021-02-06 ENCOUNTER — Ambulatory Visit (INDEPENDENT_AMBULATORY_CARE_PROVIDER_SITE_OTHER): Payer: Medicare Other | Admitting: Internal Medicine

## 2021-02-06 ENCOUNTER — Encounter: Payer: Self-pay | Admitting: Internal Medicine

## 2021-02-06 ENCOUNTER — Other Ambulatory Visit: Payer: Self-pay

## 2021-02-06 DIAGNOSIS — B20 Human immunodeficiency virus [HIV] disease: Secondary | ICD-10-CM | POA: Diagnosis not present

## 2021-02-06 DIAGNOSIS — N051 Unspecified nephritic syndrome with focal and segmental glomerular lesions: Secondary | ICD-10-CM

## 2021-02-06 DIAGNOSIS — F3342 Major depressive disorder, recurrent, in full remission: Secondary | ICD-10-CM

## 2021-02-06 MED ORDER — SYMTUZA 800-150-200-10 MG PO TABS: 1.0000 | ORAL_TABLET | Freq: Every day | ORAL | 11 refills | Status: DC

## 2021-02-06 NOTE — Progress Notes (Signed)
Patient Active Problem List   Diagnosis Date Noted  . Focal segmental glomerulosclerosis 05/28/2020    Priority: High  . Human immunodeficiency virus (HIV) disease (HCC) 07/23/2006    Priority: High  . DYSLIPIDEMIA 02/19/2010    Priority: Medium  . HYPERTENSION NEC 12/25/2009    Priority: Medium  . CIGARETTE SMOKER 05/09/2008    Priority: Medium  . ANXIETY 07/23/2006    Priority: Medium  . Polysubstance abuse (HCC) 07/23/2006    Priority: Medium  . Depression 07/23/2006    Priority: Medium  . DYSPLASIA, CERVIX NOS 07/23/2006    Priority: Medium  . Bruising 02/07/2020  . GLAUCOMA 05/09/2008  . APPENDECTOMY, HX OF 12/02/2006  . GENITAL HERPES 07/23/2006  . 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 WARTS, OTHER SPECIFIED VIRAL 07/23/2006  . SYPHILIS 07/23/2006  . ANEMIA-NOS 07/23/2006  . PNEUMONIA, RECURRENT 07/23/2006  . DENTAL CARIES 07/23/2006    Patient's Medications  New Prescriptions   No medications on file  Previous Medications   ALBUTEROL (VENTOLIN HFA) 108 (90 BASE) MCG/ACT INHALER    Inhale 2 puffs into the lungs every 4 (four) hours as needed.   BIMATOPROST (LUMIGAN) 0.01 % SOLN    Place 1 drop into both eyes at bedtime.   CHOLECALCIFEROL (VITAMIN D3) 125 MCG (5000 UT) CHEW    Chew 5,000 Units by mouth daily.   FERROUS SULFATE 325 (65 FE) MG TABLET    Take 325 mg by mouth daily with breakfast.   FISH OIL-OMEGA-3 FATTY ACIDS 1000 MG CAPSULE    Take 1 g by mouth daily.   FLUOCINOLONE ACETONIDE BODY 0.01 % OIL    Apply 1 application topically 2 (two) times a week.   FLUTICASONE (FLONASE) 50 MCG/ACT NASAL SPRAY    Place 1 spray into both nostrils daily as needed for rhinitis.    HYDROCORTISONE (ANUSOL-HC) 2.5 % RECTAL CREAM    Place 1 application rectally daily as needed for hemorrhoids or anal itching.    MULTIPLE VITAMINS-MINERALS (MULTIVITAMIN WITH MINERALS) TABLET    Take 1 tablet by mouth daily.   OMEGA-3 ACID ETHYL ESTERS (LOVAZA) 1 G CAPSULE     Take 1 g by mouth daily.   PAZEO 0.7 % SOLN    Place 1 drop into both eyes daily.    PRAVASTATIN (PRAVACHOL) 40 MG TABLET    Take 40 mg by mouth daily.    SILVER SULFADIAZINE (SILVADENE) 1 % CREAM    Apply 1 application topically daily as needed (itching).    TELMISARTAN (MICARDIS) 20 MG TABLET    Take 20 mg by mouth daily.   TURMERIC 500 MG CAPS    Take 1,000 mg by mouth daily.   VALACYCLOVIR (VALTREX) 1000 MG TABLET    Take 1/2 tablet (500 mg total) by mouth 2 (two) times daily. for 5 days as needed.  Modified Medications   Modified Medication Previous Medication   DARUNAVIR-COBICISCTAT-EMTRICITABINE-TENOFOVIR ALAFENAMIDE (SYMTUZA) 800-150-200-10 MG TABS Darunavir-Cobicisctat-Emtricitabine-Tenofovir Alafenamide (SYMTUZA) 800-150-200-10 MG TABS      Take 1 tablet by mouth daily with breakfast.    Take 1 tablet by mouth daily with breakfast.  Discontinued Medications   No medications on file    Subjective: Kammi is in for her routine HIV follow-up visit.  As usual she states that she never misses doses of her Symtuza.  She states that her depression has improved recently.  She has been talking with her wife's pastor and has been getting regular exercise  walking and swimming.  She underwent her kidney biopsy recently which revealed focal segmental glomerulosclerosis.  She has followed up with her nephrologist.  Review of Systems: Review of Systems  Constitutional: Negative for chills, diaphoresis, fever, malaise/fatigue and weight loss.  Respiratory: Negative for cough, sputum production and shortness of breath.   Cardiovascular: Negative for chest pain.  Gastrointestinal: Negative for abdominal pain, diarrhea, heartburn, nausea and vomiting.  Genitourinary: Negative for dysuria.  Musculoskeletal: Negative for joint pain.  Skin: Negative for rash.  Psychiatric/Behavioral: Negative for depression. The patient is not nervous/anxious.     Past Medical History:  Diagnosis Date  .  Depression   . HIV infection (HCC)   . Hyperlipidemia     Social History   Tobacco Use  . Smoking status: Light Tobacco Smoker    Packs/day: 0.10    Years: 35.00    Pack years: 3.50    Types: Cigarettes  . Smokeless tobacco: Never Used  . Tobacco comment: 1 per day  Substance Use Topics  . Alcohol use: No    Alcohol/week: 0.0 standard drinks  . Drug use: No    Comment: recovering, no now    No family history on file.  Allergies  Allergen Reactions  . Augmentin [Amoxicillin-Pot Clavulanate] Diarrhea    Severe - Pt request it be listed as an allergy augmentin    Health Maintenance  Topic Date Due  . COLONOSCOPY (Pts 45-37yrs Insurance coverage will need to be confirmed)  Never done  . MAMMOGRAM  Never done  . DEXA SCAN  Never done  . PNA vac Low Risk Adult (2 of 2 - PPSV23) 02/10/2020  . COVID-19 Vaccine (4 - Booster for Moderna series) 03/28/2021  . INFLUENZA VACCINE  05/13/2021  . TETANUS/TDAP  10/23/2030  . Hepatitis C Screening  Completed  . HPV VACCINES  Aged Out    Objective:  Vitals:   02/06/21 1137  BP: 130/71  Pulse: 74  Temp: (!) 97.5 F (36.4 C)  TempSrc: Oral  Weight: 132 lb (59.9 kg)   Body mass index is 20.99 kg/m.  Physical Exam Constitutional:      Comments: She is in very good spirits today.  Cardiovascular:     Rate and Rhythm: Normal rate and regular rhythm.     Heart sounds: No murmur heard.   Pulmonary:     Effort: Pulmonary effort is normal.     Breath sounds: Normal breath sounds.  Abdominal:     Palpations: Abdomen is soft.     Tenderness: There is no abdominal tenderness.  Psychiatric:        Mood and Affect: Mood normal.     Lab Results Lab Results  Component Value Date   WBC 4.8 09/03/2020   HGB 13.3 09/03/2020   HCT 42.0 09/03/2020   MCV 88.8 09/03/2020   PLT 228 09/03/2020    Lab Results  Component Value Date   CREATININE 1.18 (H) 02/07/2020   BUN 15 02/07/2020   NA 143 02/07/2020   K 3.9 02/07/2020    CL 107 02/07/2020   CO2 30 02/07/2020    Lab Results  Component Value Date   ALT 17 02/07/2020   AST 17 02/07/2020   ALKPHOS 84 03/26/2017   BILITOT 0.5 02/07/2020    Lab Results  Component Value Date   CHOL 258 (H) 02/07/2020   HDL 74 02/07/2020   LDLCALC 156 (H) 02/07/2020   TRIG 146 02/07/2020   CHOLHDL 3.5 02/07/2020   Lab Results  Component Value Date   LABRPR NON-REACTIVE 02/07/2020   HIV 1 RNA Quant  Date Value  08/08/2020 <20 Copies/mL  02/07/2020 27 copies/mL (H)  08/09/2019 <20 NOT DETECTED copies/mL   CD4 T Cell Abs (/uL)  Date Value  08/08/2020 679  02/07/2020 696  08/09/2019 794     Problem List Items Addressed This Visit      High   Human immunodeficiency virus (HIV) disease (HCC)    Her infection remains under excellent, long-term control.  She will continue Symtuza and follow-up in 6 months.  She received her Moderna COVID booster in December.      Relevant Medications   Darunavir-Cobicisctat-Emtricitabine-Tenofovir Alafenamide (SYMTUZA) 800-150-200-10 MG TABS   Focal segmental glomerulosclerosis    She has chronic renal insufficiency due to her glomerulosclerosis.  She will continue to be followed by her nephrologist.        Medium   Depression    Her depression is now back in remission.           Cliffton Asters, MD [facility] Infectious Disease [facility] [phone] pager   [phone] cell 02/06/2021, 12:04 PM

## 2021-02-06 NOTE — Assessment & Plan Note (Signed)
Her infection remains under excellent, long-term control.  She will continue Symtuza and follow-up in 6 months.  She received her Moderna COVID booster in December.

## 2021-02-06 NOTE — Assessment & Plan Note (Signed)
Her depression is now back in remission.

## 2021-02-06 NOTE — Assessment & Plan Note (Signed)
She has chronic renal insufficiency due to her glomerulosclerosis.  She will continue to be followed by her nephrologist.

## 2021-08-07 DIAGNOSIS — E236 Other disorders of pituitary gland: Secondary | ICD-10-CM | POA: Insufficient documentation

## 2021-08-08 ENCOUNTER — Ambulatory Visit (INDEPENDENT_AMBULATORY_CARE_PROVIDER_SITE_OTHER): Payer: Medicare Other | Admitting: Internal Medicine

## 2021-08-08 ENCOUNTER — Ambulatory Visit (INDEPENDENT_AMBULATORY_CARE_PROVIDER_SITE_OTHER): Payer: Medicare Other

## 2021-08-08 ENCOUNTER — Encounter: Payer: Self-pay | Admitting: Internal Medicine

## 2021-08-08 ENCOUNTER — Other Ambulatory Visit: Payer: Self-pay

## 2021-08-08 VITALS — BP 169/83 | Temp 97.2°F | Resp 16 | Ht 66.5 in | Wt 136.6 lb

## 2021-08-08 DIAGNOSIS — F3341 Major depressive disorder, recurrent, in partial remission: Secondary | ICD-10-CM | POA: Diagnosis not present

## 2021-08-08 DIAGNOSIS — B20 Human immunodeficiency virus [HIV] disease: Secondary | ICD-10-CM

## 2021-08-08 DIAGNOSIS — Z23 Encounter for immunization: Secondary | ICD-10-CM | POA: Diagnosis not present

## 2021-08-08 DIAGNOSIS — F17200 Nicotine dependence, unspecified, uncomplicated: Secondary | ICD-10-CM

## 2021-08-08 DIAGNOSIS — E236 Other disorders of pituitary gland: Secondary | ICD-10-CM | POA: Diagnosis not present

## 2021-08-08 MED ORDER — SYMTUZA 800-150-200-10 MG PO TABS: 1.0000 | ORAL_TABLET | Freq: Every day | ORAL | 11 refills | Status: DC

## 2021-08-08 NOTE — Progress Notes (Signed)
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   Human immunodeficiency virus (HIV) disease (HCC) 07/23/2006    Priority: 1.   DYSLIPIDEMIA 02/19/2010    Priority: 2.   HYPERTENSION NEC 12/25/2009    Priority: 2.   CIGARETTE SMOKER 05/09/2008    Priority: 2.   ANXIETY 07/23/2006    Priority: 2.   Polysubstance abuse (HCC) 07/23/2006    Priority: 2.   Depression 07/23/2006    Priority: 2.   DYSPLASIA, CERVIX NOS 07/23/2006    Priority: 2.   Enlarged pituitary gland (HCC) 08/07/2021   Bruising 02/07/2020   GLAUCOMA 05/09/2008   APPENDECTOMY, HX OF 12/02/2006   GENITAL HERPES 07/23/2006   HERPES SIMPLEX KERATITIS 07/23/2006   WARTS, OTHER SPECIFIED VIRAL 07/23/2006   SYPHILIS 07/23/2006   ANEMIA-NOS 07/23/2006   PNEUMONIA, RECURRENT 07/23/2006   DENTAL CARIES 07/23/2006    Patient's Medications  New Prescriptions   No medications on file  Previous Medications   ALBUTEROL (VENTOLIN HFA) 108 (90 BASE) MCG/ACT INHALER    Inhale 2 puffs into the lungs every 4 (four) hours as needed.   BIMATOPROST (LUMIGAN) 0.01 % SOLN    Place 1 drop into both eyes at bedtime.   CHOLECALCIFEROL (VITAMIN D3) 125 MCG (5000 UT) CHEW    Chew 5,000 Units by mouth daily.   FERROUS SULFATE 325 (65 FE) MG TABLET    Take 325 mg by mouth daily with breakfast.   FISH OIL-OMEGA-3 FATTY ACIDS 1000 MG CAPSULE    Take 1 g by mouth daily.   FLUOCINOLONE ACETONIDE BODY 0.01 % OIL    Apply 1 application topically 2 (two) times a week.   FLUTICASONE (FLONASE) 50 MCG/ACT NASAL SPRAY    Place 1 spray into both nostrils daily as needed for rhinitis.    HYDROCORTISONE (ANUSOL-HC) 2.5 % RECTAL CREAM    Place 1 application rectally daily as needed for hemorrhoids or anal itching.    MULTIPLE VITAMINS-MINERALS (MULTIVITAMIN WITH MINERALS) TABLET    Take 1 tablet by mouth daily.   OMEGA-3 ACID ETHYL ESTERS (LOVAZA) 1 G CAPSULE    Take 1 g  by mouth daily.   PAZEO 0.7 % SOLN    Place 1 drop into both eyes daily.    PRAVASTATIN (PRAVACHOL) 40 MG TABLET    Take 40 mg by mouth daily.    SILVER SULFADIAZINE (SILVADENE) 1 % CREAM    Apply 1 application topically daily as needed (itching).    TELMISARTAN (MICARDIS) 20 MG TABLET    Take 20 mg by mouth daily.   TURMERIC 500 MG CAPS    Take 1,000 mg by mouth daily.   VALACYCLOVIR (VALTREX) 1000 MG TABLET    Take 1/2 tablet (500 mg total) by mouth 2 (two) times daily. for 5 days as needed.  Modified Medications   Modified Medication Previous Medication   DARUNAVIR-COBICISTAT-EMTRICITABINE-TENOFOVIR ALAFENAMIDE (SYMTUZA) 800-150-200-10 MG TABS Darunavir-Cobicisctat-Emtricitabine-Tenofovir Alafenamide (SYMTUZA) 800-150-200-10 MG TABS      Take 1 tablet by mouth daily with breakfast.    Take 1 tablet by mouth daily with breakfast.  Discontinued Medications   No medications on file    Subjective: Kara Munoz is in for her routine HIV follow-up visit.  Fortunately she has not had any problems obtaining, taking or tolerating her Symtuza.  She does not recall missing any new doses.  She says that her mood is "  up-and-down."  She says it helps if she keeps her self busy.  She recently helped her ex-husband with his annual drive to give food and clothing to people in her community.  Her last COVID booster was in December of last year.  He is using nicotine patches and smoking intermittently.  Review of Systems: Review of Systems  Constitutional:  Negative for fever and weight loss.  Gastrointestinal:  Negative for abdominal pain, diarrhea, nausea and vomiting.  Psychiatric/Behavioral:  Positive for depression.    Past Medical History:  Diagnosis Date   Depression    HIV infection (HCC)    Hyperlipidemia     Social History   Tobacco Use   Smoking status: Light Smoker    Packs/day: 0.10    Years: 35.00    Pack years: 3.50    Types: Cigarettes   Smokeless tobacco: Never   Tobacco comments:     1 per day  Substance Use Topics   Alcohol use: No    Alcohol/week: 0.0 standard drinks   Drug use: No    Comment: recovering, no now    No family history on file.  Allergies  Allergen Reactions   Augmentin [Amoxicillin-Pot Clavulanate] Diarrhea    Severe - Pt request it be listed as an allergy augmentin    Health Maintenance  Topic Date Due   COLONOSCOPY (Pts 45-86yrs Insurance coverage will need to be confirmed)  Never done   MAMMOGRAM  Never done   DEXA SCAN  Never done   Pneumonia Vaccine 28+ Years old (4 - PPSV23 if available, else PCV20) 02/10/2020   COVID-19 Vaccine (4 - Booster for Moderna series) 11/22/2020   INFLUENZA VACCINE  05/13/2021   TETANUS/TDAP  10/23/2030   Hepatitis C Screening  Completed   Zoster Vaccines- Shingrix  Completed   HPV VACCINES  Aged Out    Objective:  Vitals:   08/08/21 1104  BP: (!) 169/83  Resp: 16  Temp: (!) 97.2 F (36.2 C)  TempSrc: Temporal  SpO2: 91%  Weight: 136 lb 9.6 oz (62 kg)  Height: 5' 6.5" (1.689 m)   Body mass index is 21.72 kg/m.  Physical Exam Constitutional:      Comments: She is talkative and in good spirits as usual.  Cardiovascular:     Rate and Rhythm: Normal rate and regular rhythm.     Heart sounds: No murmur heard. Pulmonary:     Effort: Pulmonary effort is normal.     Breath sounds: Normal breath sounds.  Abdominal:     Palpations: Abdomen is soft.     Tenderness: There is no abdominal tenderness.  Psychiatric:        Mood and Affect: Mood normal.    Lab Results Lab Results  Component Value Date   WBC 4.8 09/03/2020   HGB 13.3 09/03/2020   HCT 42.0 09/03/2020   MCV 88.8 09/03/2020   PLT 228 09/03/2020    Lab Results  Component Value Date   CREATININE 1.18 (H) 02/07/2020   BUN 15 02/07/2020   NA 143 02/07/2020   K 3.9 02/07/2020   CL 107 02/07/2020   CO2 30 02/07/2020    Lab Results  Component Value Date   ALT 17 02/07/2020   AST 17 02/07/2020   ALKPHOS 84 03/26/2017    BILITOT 0.5 02/07/2020    Lab Results  Component Value Date   CHOL 258 (H) 02/07/2020   HDL 74 02/07/2020   LDLCALC 156 (H) 02/07/2020   TRIG 146 02/07/2020  CHOLHDL 3.5 02/07/2020   Lab Results  Component Value Date   LABRPR NON-REACTIVE 02/07/2020   HIV 1 RNA Quant  Date Value  08/08/2020 <20 Copies/mL  02/07/2020 27 copies/mL (H)  08/09/2019 <20 NOT DETECTED copies/mL   CD4 T Cell Abs (/uL)  Date Value  08/08/2020 679  02/07/2020 696  08/09/2019 794     Problem List Items Addressed This Visit       1.   Human immunodeficiency virus (HIV) disease (HCC)    Her infection has been under excellent, long-term control.  She will continue Symtuza and get repeat blood work today.  She will follow-up in 1 year.  She received her annual influenza vaccine and a COVID booster vaccine here today.      Relevant Medications   Darunavir-Cobicistat-Emtricitabine-Tenofovir Alafenamide (SYMTUZA) 800-150-200-10 MG TABS   Other Relevant Orders   T-helper cell (CD4)- ([facility] clinic only)   HIV-1 RNA quant-no reflex-bld   CBC   Comprehensive metabolic panel   RPR   Lipid panel     2.   CIGARETTE SMOKER    She has quit smoking in the past.  She restarted smoking cigarettes after her sister died.  I encouraged her to do her best to quit again.      Depression    Her depression is quite mild and under much better control than a decade ago.        Unprioritized   Enlarged pituitary gland (HCC)      Cliffton Asters, MD [facility] for Infectious Disease [facility] [phone] pager   [phone] cell 08/08/2021, 11:32 AM

## 2021-08-08 NOTE — Assessment & Plan Note (Signed)
Her infection has been under excellent, long-term control.  She will continue Symtuza and get repeat blood work today.  She will follow-up in 1 year.  She received her annual influenza vaccine and a COVID booster vaccine here today.

## 2021-08-08 NOTE — Progress Notes (Addendum)
   Covid-19 Vaccination Clinic  Name:  Kara Munoz    MRN: [id number] DOB: 02-Apr-1956  08/21/2021  Kara Munoz was observed post Covid-19 immunization for 15 minutes without incident. She was provided with Vaccine Information Sheet and instruction to access the V-Safe system.   Kara Munoz was instructed to call 911 with any severe reactions post vaccine: Difficulty breathing  Swelling of face and throat  A fast heartbeat  A bad rash all over body  Dizziness and weakness   Immunizations Administered     Name Date Dose VIS Date Route   Pfizer Covid-19 Vaccine Bivalent Booster 08/08/2021 11:36 AM 0.3 mL 06/12/2021 Intramuscular   Manufacturer: [employer], [employer]   Lot: [id number]   NDC: [id number]       Kara Munoz Lesli Albee, CMA

## 2021-08-08 NOTE — Patient Instructions (Signed)
Kara Munoz has been on daily Boost supplements for many years.  Her HIV infection has now been under excellent, long-term control and she no longer has any nutritional deficiencies.  I will have her stop the boost supplements when her current prescription runs out.  I instructed her to use supplements such as instant breakfast instead.  She is in agreement with that plan.

## 2021-08-08 NOTE — Assessment & Plan Note (Signed)
Her depression is quite mild and under much better control than a decade ago.

## 2021-08-08 NOTE — Assessment & Plan Note (Signed)
She has quit smoking in the past.  She restarted smoking cigarettes after her sister died.  I encouraged her to do her best to quit again.

## 2021-08-09 LAB — T-HELPER CELL (CD4) - (RCID CLINIC ONLY)
CD4 % Helper T Cell: 31 % — ABNORMAL LOW (ref 33–65)
CD4 T Cell Abs: 481 /uL (ref 400–1790)

## 2021-08-12 LAB — COMPREHENSIVE METABOLIC PANEL
AG Ratio: 1.7 (calc) (ref 1.0–2.5)
ALT: 13 U/L (ref 6–29)
AST: 15 U/L (ref 10–35)
Albumin: 4.3 g/dL (ref 3.6–5.1)
Alkaline phosphatase (APISO): 87 U/L (ref 37–153)
BUN/Creatinine Ratio: 18 (calc) (ref 6–22)
BUN: 21 mg/dL (ref 7–25)
CO2: 28 mmol/L (ref 20–32)
Calcium: 10.8 mg/dL — ABNORMAL HIGH (ref 8.6–10.4)
Chloride: 108 mmol/L (ref 98–110)
Creat: 1.14 mg/dL — ABNORMAL HIGH (ref 0.50–1.05)
Globulin: 2.5 g/dL (calc) (ref 1.9–3.7)
Glucose, Bld: 69 mg/dL (ref 65–99)
Potassium: 4.1 mmol/L (ref 3.5–5.3)
Sodium: 143 mmol/L (ref 135–146)
Total Bilirubin: 0.4 mg/dL (ref 0.2–1.2)
Total Protein: 6.8 g/dL (ref 6.1–8.1)

## 2021-08-12 LAB — LIPID PANEL
Cholesterol: 242 mg/dL — ABNORMAL HIGH (ref <200)
HDL: 79 mg/dL (ref >=50)
LDL Cholesterol (Calc): 135 mg/dL (calc) — ABNORMAL HIGH
Non-HDL Cholesterol (Calc): 163 mg/dL (calc) — ABNORMAL HIGH (ref <130)
Total CHOL/HDL Ratio: 3.1 (calc) (ref <5.0)
Triglycerides: 151 mg/dL — ABNORMAL HIGH (ref <150)

## 2021-08-12 LAB — CBC
HCT: 44.3 % (ref 35.0–45.0)
Hemoglobin: 14.7 g/dL (ref 11.7–15.5)
MCH: 29.3 pg (ref 27.0–33.0)
MCHC: 33.2 g/dL (ref 32.0–36.0)
MCV: 88.4 fL (ref 80.0–100.0)
MPV: 11.6 fL (ref 7.5–12.5)
Platelets: 241 10*3/uL (ref 140–400)
RBC: 5.01 10*6/uL (ref 3.80–5.10)
RDW: 14.5 % (ref 11.0–15.0)
WBC: 14.5 10*3/uL — ABNORMAL HIGH (ref 3.8–10.8)

## 2021-08-12 LAB — HIV-1 RNA QUANT-NO REFLEX-BLD
HIV 1 RNA Quant: NOT DETECTED Copies/mL
HIV-1 RNA Quant, Log: NOT DETECTED Log cps/mL

## 2021-08-12 LAB — RPR: RPR Ser Ql: NONREACTIVE

## 2021-12-30 IMAGING — US US BIOPSY
1 series · 12 of 12 positions shown · non-contrast
Comparison: none

INDICATION: 64-year-old female with a history of proteinuria referred for biopsy

[Series 1: us biopsy (kidney) · 12 of 12 slices shown]
[im 1/12]
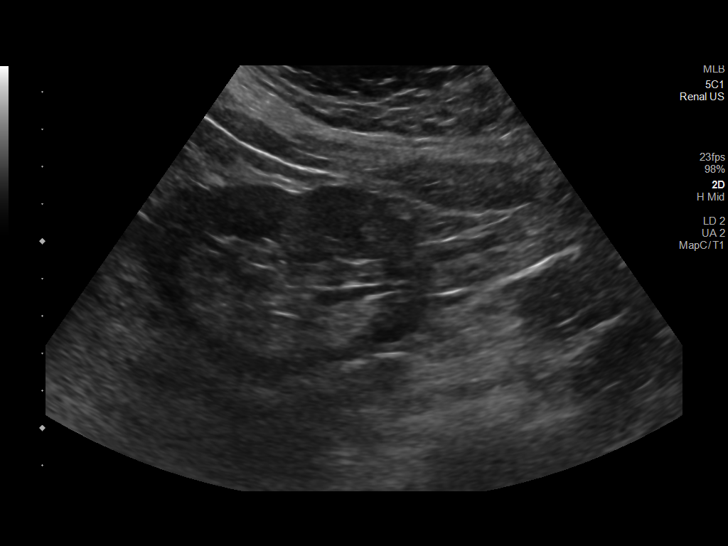
[im 2/12]
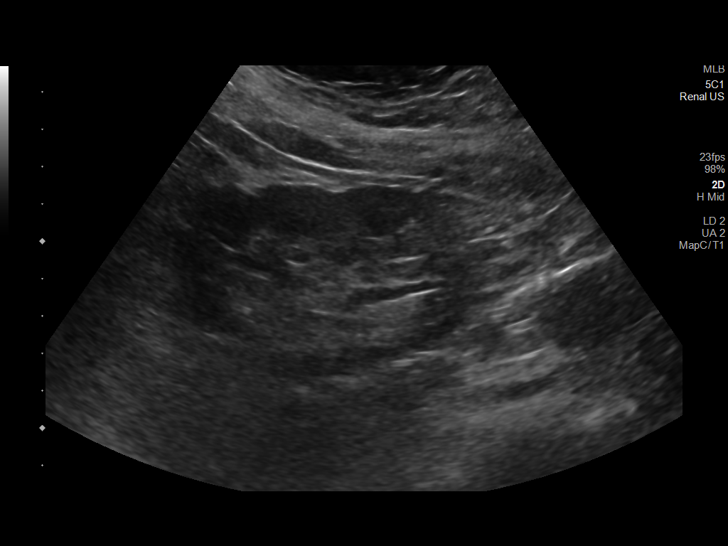
[im 3/12]
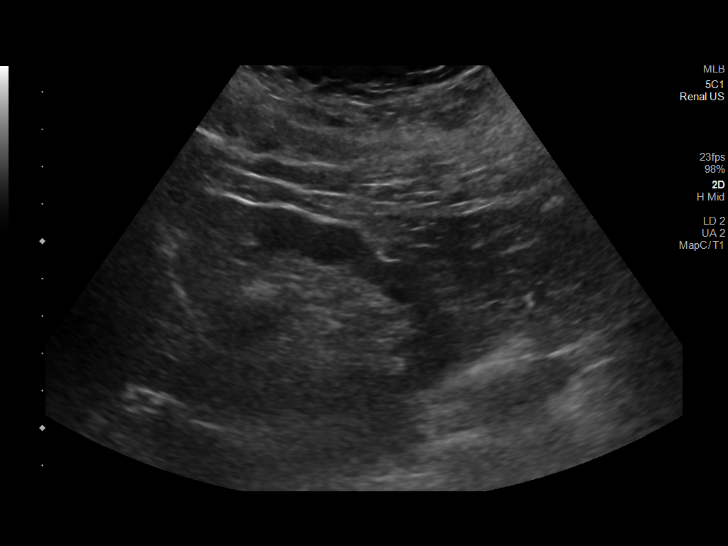
[im 4/12]
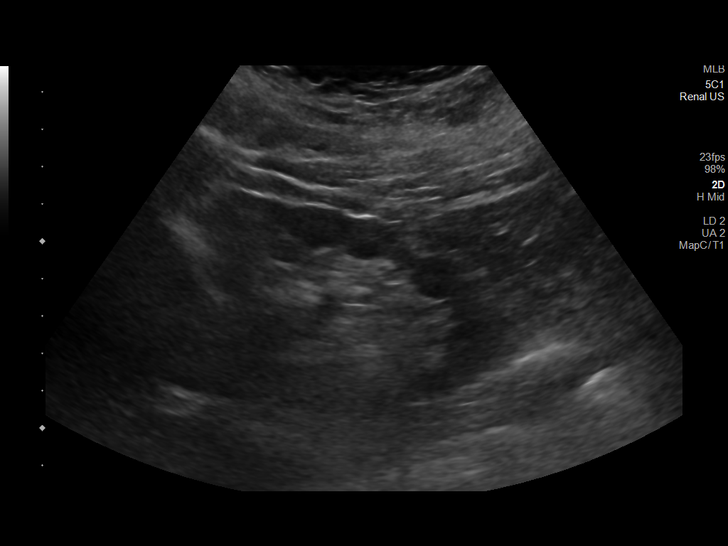
[im 5/12]
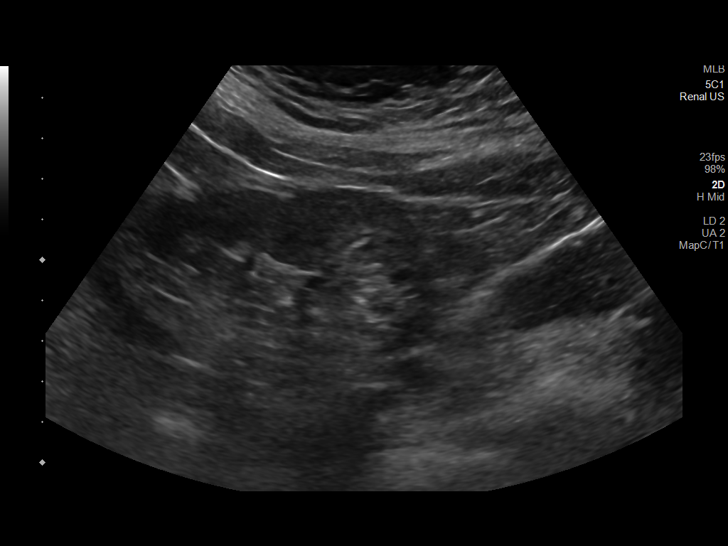
[im 6/12]
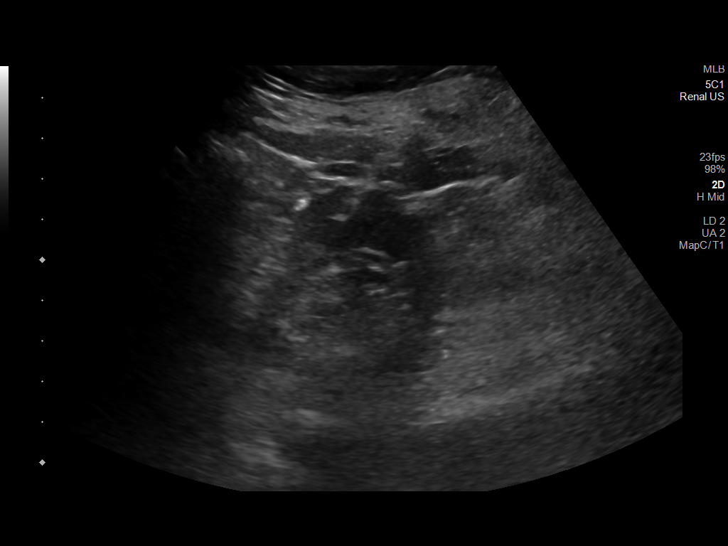
[im 7/12]
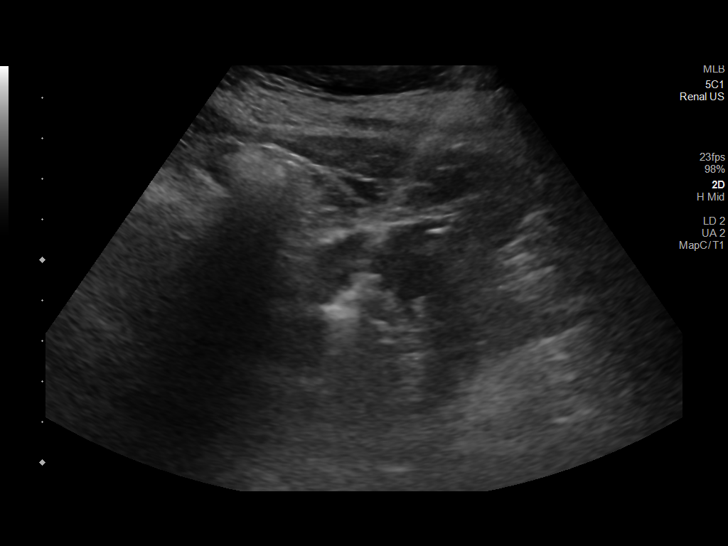
[im 8/12]
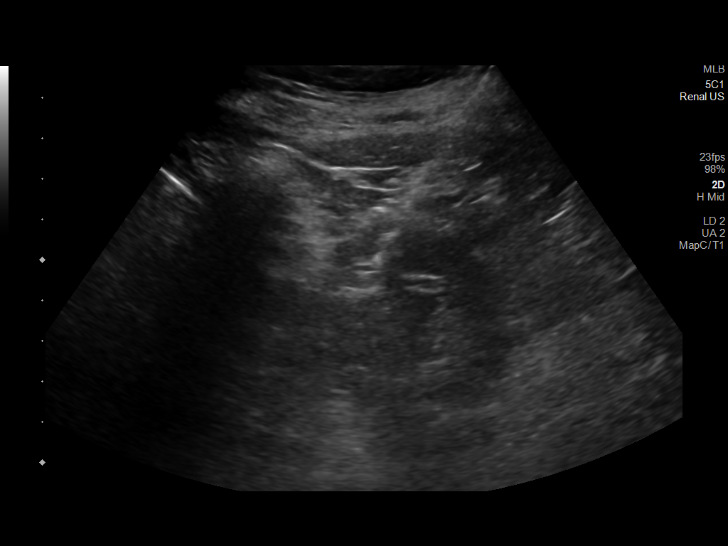
[im 9/12]
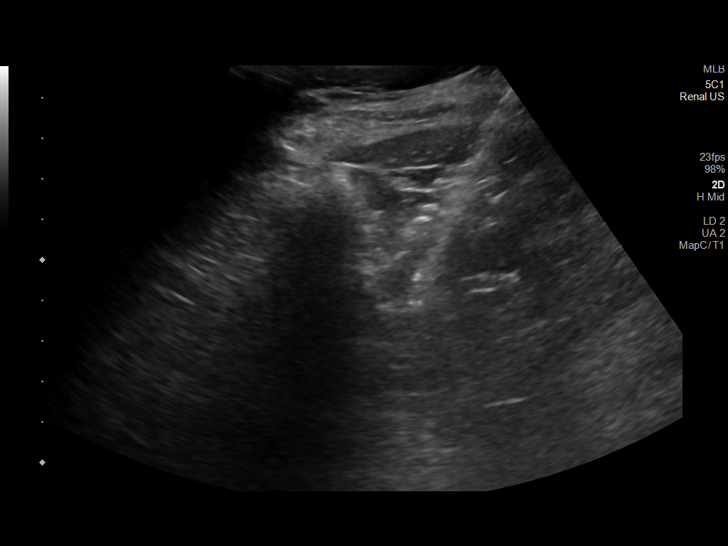
[im 10/12]
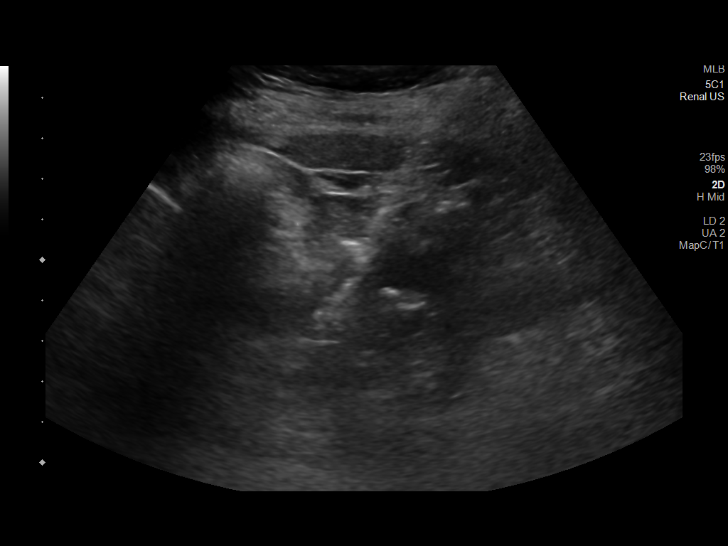
[im 11/12]
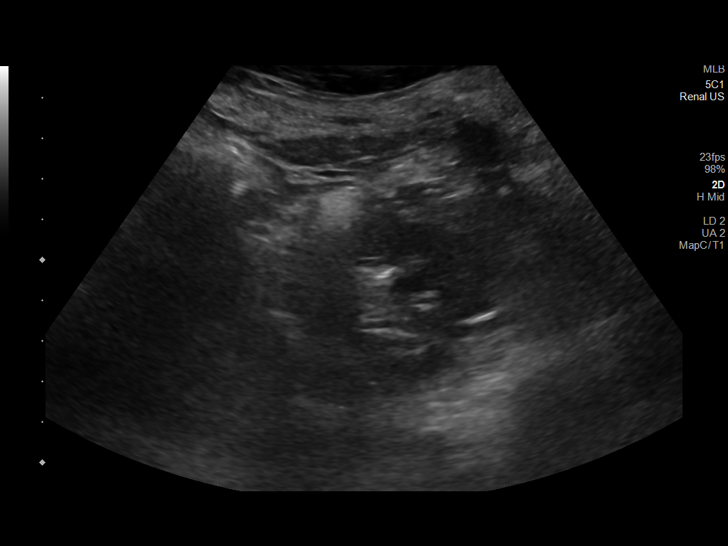
[im 12/12]
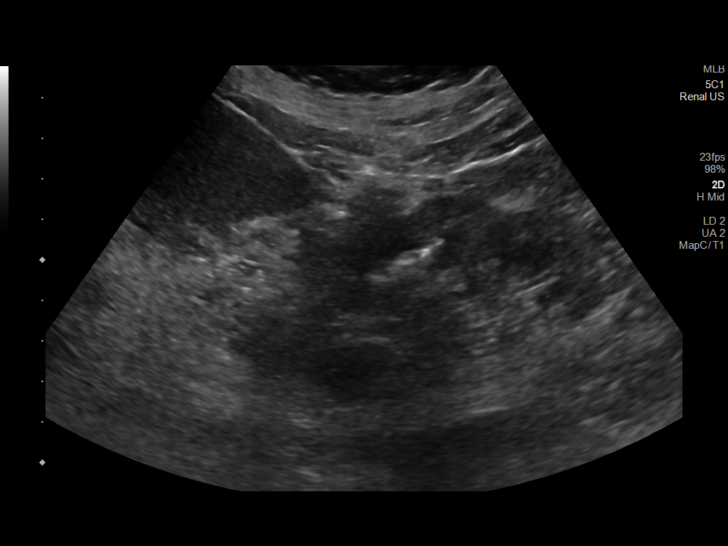

[12 of 12 positions shown; findings below may reference images not displayed]

EXAM:
ULTRASOUND-GUIDED BIOPSY MEDICAL RENAL

MEDICATIONS:
None.

ANESTHESIA/SEDATION:
Moderate (conscious) sedation was employed during this procedure. A
total of Versed 1.0 mg and Fentanyl 50 mcg was administered
intravenously.

Moderate Sedation Time: 15 minutes. The patient's level of
consciousness and vital signs were monitored continuously by
radiology nursing throughout the procedure under my direct
supervision.

FLUOROSCOPY TIME:  Ultrasound

COMPLICATIONS:
None

PROCEDURE:
Informed written consent was obtained from the patient after a
thorough discussion of the procedural risks, benefits and
alternatives. All questions were addressed. Maximal Sterile Barrier
Technique was utilized including caps, mask, sterile gowns, sterile
gloves, sterile drape, hand hygiene and skin antiseptic. A timeout
was performed prior to the initiation of the procedure.

Patient was positioned prone position on the gantry table. Images
were stored sent to PACs.

Once the patient is prepped and draped in the usual sterile fashion,
the skin and subcutaneous tissues overlying the left kidney were
generously infiltrated 1% lidocaine for local anesthesia.

Using ultrasound guidance, a 15 gauge guide needle was advanced into
the lower lateral cortex of the left kidney.

Once we confirmed location of the needle tip, 4 separate 16 gauge
core biopsy were achieved.

Two Gel-Foam pledgets were infused with a small amount of saline.
The needle was removed.

Final images were stored.

The patient tolerated the procedure well and remained
hemodynamically stable throughout.

No complications were encountered and no significant blood loss
encountered.
IMPRESSION: Status post ultrasound-guided medical renal biopsy left kidney.

## 2022-08-12 DIAGNOSIS — D013 Carcinoma in situ of anus and anal canal: Secondary | ICD-10-CM | POA: Insufficient documentation

## 2022-08-13 ENCOUNTER — Ambulatory Visit (INDEPENDENT_AMBULATORY_CARE_PROVIDER_SITE_OTHER): Payer: Medicare Other | Admitting: Internal Medicine

## 2022-08-13 ENCOUNTER — Encounter: Payer: Self-pay | Admitting: Internal Medicine

## 2022-08-13 ENCOUNTER — Other Ambulatory Visit: Payer: Self-pay

## 2022-08-13 VITALS — BP 133/80 | HR 79 | Temp 97.5°F | Ht 66.0 in | Wt 138.0 lb

## 2022-08-13 DIAGNOSIS — B20 Human immunodeficiency virus [HIV] disease: Secondary | ICD-10-CM

## 2022-08-13 DIAGNOSIS — D013 Carcinoma in situ of anus and anal canal: Secondary | ICD-10-CM | POA: Diagnosis not present

## 2022-08-13 DIAGNOSIS — F17210 Nicotine dependence, cigarettes, uncomplicated: Secondary | ICD-10-CM | POA: Diagnosis not present

## 2022-08-13 DIAGNOSIS — Z23 Encounter for immunization: Secondary | ICD-10-CM | POA: Diagnosis not present

## 2022-08-13 DIAGNOSIS — F3341 Major depressive disorder, recurrent, in partial remission: Secondary | ICD-10-CM

## 2022-08-13 DIAGNOSIS — F17200 Nicotine dependence, unspecified, uncomplicated: Secondary | ICD-10-CM

## 2022-08-13 MED ORDER — SYMTUZA 800-150-200-10 MG PO TABS: 1.0000 | ORAL_TABLET | Freq: Every day | ORAL | 11 refills | Status: DC

## 2022-08-13 NOTE — Assessment & Plan Note (Signed)
She will continue topical imiquimod and follow-up with Dr. Alcide Evener later this month.

## 2022-08-13 NOTE — Assessment & Plan Note (Signed)
I talked to her about how she might better manage the triggers for her depression.  I also encouraged her to continue her regular exercise.

## 2022-08-13 NOTE — Progress Notes (Signed)
Patient Active Problem List   Diagnosis Date Noted   Anal intraepithelial neoplasia III (AIN III) 08/12/2022    Priority: High   Focal segmental glomerulosclerosis 05/28/2020    Priority: High   Human immunodeficiency virus (HIV) disease ([place]) 07/23/2006    Priority: High   DYSLIPIDEMIA 02/19/2010    Priority: Medium    HYPERTENSION NEC 12/25/2009    Priority: Medium    CIGARETTE SMOKER 05/09/2008    Priority: Medium    ANXIETY 07/23/2006    Priority: Medium    Polysubstance abuse ([place]) 07/23/2006    Priority: Medium    Depression 07/23/2006    Priority: Medium    DYSPLASIA, CERVIX NOS 07/23/2006    Priority: Medium    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   BIMATOPROST (LUMIGAN) 0.01 % SOLN    Place 1 drop into both eyes at bedtime.   CHOLECALCIFEROL (VITAMIN D3) 125 MCG (5000 UT) CHEW    Chew 5,000 Units by mouth daily.   FERROUS SULFATE 325 (65 FE) MG TABLET    Take 325 mg by mouth daily with breakfast.   FISH OIL-OMEGA-3 FATTY ACIDS 1000 MG CAPSULE    Take 1 g by mouth daily.   FLUOCINOLONE ACETONIDE BODY 0.01 % OIL    Apply 1 application topically 2 (two) times a week.   HYDROCORTISONE (ANUSOL-HC) 2.5 % RECTAL CREAM    Place 1 application rectally daily as needed for hemorrhoids or anal itching.    MULTIPLE VITAMINS-MINERALS (MULTIVITAMIN WITH MINERALS) TABLET    Take 1 tablet by mouth daily.   OLOPATADINE HCL (PATADAY) 0.2 % SOLN    Place one drop  into both eyes every morning.   OMEGA-3 ACID ETHYL ESTERS (LOVAZA) 1 G CAPSULE    Take 1 g by mouth daily.   PAZEO 0.7 % SOLN    Place 1 drop into both eyes daily.    PRAVASTATIN (PRAVACHOL) 40 MG TABLET    Take 40 mg by mouth daily.    SILVER SULFADIAZINE (SILVADENE) 1 % CREAM    Apply 1 application topically daily as needed (itching).    TELMISARTAN (MICARDIS) 20 MG TABLET    Take 20 mg by mouth daily.   TURMERIC 500 MG CAPS    Take 1,000 mg by mouth daily.   VALACYCLOVIR (VALTREX) 1000 MG TABLET    Take 1/2 tablet (500 mg total) by mouth 2 (two) times daily. for 5 days as needed.  Modified Medications   Modified Medication Previous Medication   DARUNAVIR-COBICISTAT-EMTRICITABINE-TENOFOVIR ALAFENAMIDE (SYMTUZA) 800-150-200-10 MG TABS Darunavir-Cobicistat-Emtricitabine-Tenofovir Alafenamide (SYMTUZA) 800-150-200-10 MG TABS      Take 1 tablet by mouth daily with breakfast.    Take 1 tablet by mouth daily with breakfast.  Discontinued Medications   FLUTICASONE (FLONASE) 50 MCG/ACT NASAL SPRAY    Place 1 spray into both nostrils daily as needed for rhinitis.     Subjective: Kara Munoz is in for her  routine HIV follow-up visit.  She has not had any problems obtaining, taking or tolerating her Symtuza and never misses doses.  She recently had the "flu".  She was treated with oseltamavir last week.  She was also given a prescription for Flonase but has not been taking it.  She is feeling better.  She has not had her annual influenza vaccine or an updated COVID-vaccine.  She still has periods of depression but says that the episodes are not nearly as prolonged or severe as they were decades ago when we first met.  She notes that being around her daughter is a trigger for her depression.  She feels like her daughter is frequently disrespectful to her.  She is getting regular exercise.  She belongs to [employer].  She also does swim aerobics twice weekly.  She is still smoking but says that she is  actively trying to cut down.  She has a history of anal dysplasia.  She underwent repeat examination and biopsy on 06/10/2022 which again showed AIN III.  She was started on topical imiquimod which she is tolerating.  She already has follow-up scheduled.  Review of Systems: Review of Systems  Constitutional:  Negative for fever and weight loss.  HENT:  Positive for congestion.   Musculoskeletal:  Positive for back pain.  Psychiatric/Behavioral:  Positive for depression. Negative for substance abuse. The patient is not nervous/anxious.     Past Medical History:  Diagnosis Date   Depression    HIV infection (HCC)    Hyperlipidemia     Social History   Tobacco Use   Smoking status: Light Smoker    Packs/day: 0.10    Years: 35.00    Total pack years: 3.50    Types: Cigarettes   Smokeless tobacco: Never   Tobacco comments:    1 pack per week  Substance Use Topics   Alcohol use: No    Alcohol/week: 0.0 standard drinks of alcohol   Drug use: No    Comment: recovering, no now    No family history on file.  Allergies  Allergen Reactions   Augmentin [Amoxicillin-Pot Clavulanate] Diarrhea    Severe - Pt request it be listed as an allergy augmentin    Health Maintenance  Topic Date Due   COLONOSCOPY (Pts 45-49yrs Insurance coverage will need to be confirmed)  Never done   MAMMOGRAM  Never done   DEXA SCAN  Never done   Pneumonia Vaccine 65+ Years old (3 - PPSV23 or PCV20) 02/10/2020   COVID-19 Vaccine (5 - Moderna risk series) 10/03/2021   Medicare Annual Wellness (AWV)  03/19/2023   TETANUS/TDAP  10/23/2030   INFLUENZA VACCINE  Completed   Hepatitis C Screening  Completed   Zoster Vaccines- Shingrix  Completed   HPV VACCINES  Aged Out    Objective:  Vitals:   08/13/22 1126  BP: 133/80  Pulse: 79  Temp: (!) 97.5 F (36.4 C)  TempSrc: Oral  SpO2: 99%  Weight: 138 lb (62.6 kg)  Height: 5' 6" (1.676 m)   Body mass index is 22.27 kg/m.  Physical  Exam Constitutional:      Comments: She is talkative and in good spirits as usual.  Cardiovascular:     Rate and Rhythm: Normal rate and regular rhythm.     Heart sounds: No murmur heard. Pulmonary:     Effort: Pulmonary effort is normal.     Breath sounds: Normal breath sounds.  Psychiatric:          Mood and Affect: Mood normal.     Lab Results Lab Results  Component Value Date   WBC 14.5 (H) 08/08/2021   HGB 14.7 08/08/2021   HCT 44.3 08/08/2021   MCV 88.4 08/08/2021   PLT 241 08/08/2021    Lab Results  Component Value Date   CREATININE 1.14 (H) 08/08/2021   BUN 21 08/08/2021   NA 143 08/08/2021   K 4.1 08/08/2021   CL 108 08/08/2021   CO2 28 08/08/2021    Lab Results  Component Value Date   ALT 13 08/08/2021   AST 15 08/08/2021   ALKPHOS 84 03/26/2017   BILITOT 0.4 08/08/2021    Lab Results  Component Value Date   CHOL 242 (H) 08/08/2021   HDL 79 08/08/2021   LDLCALC 135 (H) 08/08/2021   TRIG 151 (H) 08/08/2021   CHOLHDL 3.1 08/08/2021   Lab Results  Component Value Date   LABRPR NON-REACTIVE 08/08/2021   HIV 1 RNA Quant  Date Value  08/08/2021 Not Detected Copies/mL  08/08/2020 <20 Copies/mL  02/07/2020 27 copies/mL (H)   CD4 T Cell Abs (/uL)  Date Value  08/08/2021 481  08/08/2020 679  02/07/2020 696     Problem List Items Addressed This Visit       High   Human immunodeficiency virus (HIV) disease (HCC)    Her adherence is excellent and her infection has been under perfect long-term control.  She will get updated lab work today, continue Symtuza and follow-up in 6 months.  She received her annual influenza vaccine here today.  I encouraged her to get an updated COVID-vaccine at her pharmacy within the next month.  I told her to not use Flonase because of a drug drug interaction with a component of Symtuza.      Relevant Medications   Darunavir-Cobicistat-Emtricitabine-Tenofovir Alafenamide (SYMTUZA) 800-150-200-10 MG TABS   Other Relevant  Orders   T-helper cells (CD4) count (not at [facility])   HIV-1 RNA quant-no reflex-bld   CBC   Comprehensive metabolic panel   RPR   Lipid panel   Anal intraepithelial neoplasia III (AIN III)    She will continue topical imiquimod and follow-up with Dr. Nicholas Robinson later this month.        Medium    CIGARETTE SMOKER    I encouraged her to set a quit date and do her best to stop smoking cigarettes completely.      Depression    I talked to her about how she might better manage the triggers for her depression.  I also encouraged her to continue her regular exercise.      Other Visit Diagnoses     Need for immunization against influenza    -  Primary   Relevant Orders   Flu Vaccine QUAD High Dose(Fluad) (Completed)         John Campbell, MD [facility] [phone] pager   [phone] cell 08/13/2022, 12:03 PM  

## 2022-08-13 NOTE — Assessment & Plan Note (Signed)
I encouraged her to set a quit date and do her best to stop smoking cigarettes completely.

## 2022-08-13 NOTE — Assessment & Plan Note (Signed)
Her adherence is excellent and her infection has been under perfect long-term control.  She will get updated lab work today, continue [place] and follow-up in 6 months.  She received her annual influenza vaccine here today.  I encouraged her to get an updated COVID-vaccine at her pharmacy within the next month.  I told her to not use Flonase because of a drug drug interaction with a component of Symtuza.

## 2022-08-14 LAB — T-HELPER CELLS (CD4) COUNT (NOT AT ARMC)
CD4 % Helper T Cell: 40 % (ref 33–65)
CD4 T Cell Abs: 673 /uL (ref 400–1790)

## 2022-08-15 LAB — COMPREHENSIVE METABOLIC PANEL
AG Ratio: 1.6 (calc) (ref 1.0–2.5)
ALT: 14 U/L (ref 6–29)
AST: 19 U/L (ref 10–35)
Albumin: 4 g/dL (ref 3.6–5.1)
Alkaline phosphatase (APISO): 64 U/L (ref 37–153)
BUN/Creatinine Ratio: 13 (calc) (ref 6–22)
BUN: 17 mg/dL (ref 7–25)
CO2: 28 mmol/L (ref 20–32)
Calcium: 10.5 mg/dL — ABNORMAL HIGH (ref 8.6–10.4)
Chloride: 112 mmol/L — ABNORMAL HIGH (ref 98–110)
Creat: 1.33 mg/dL — ABNORMAL HIGH (ref 0.50–1.05)
Globulin: 2.5 g/dL (calc) (ref 1.9–3.7)
Glucose, Bld: 54 mg/dL — ABNORMAL LOW (ref 65–99)
Potassium: 3.9 mmol/L (ref 3.5–5.3)
Sodium: 147 mmol/L — ABNORMAL HIGH (ref 135–146)
Total Bilirubin: 0.5 mg/dL (ref 0.2–1.2)
Total Protein: 6.5 g/dL (ref 6.1–8.1)

## 2022-08-15 LAB — LIPID PANEL
Cholesterol: 211 mg/dL — ABNORMAL HIGH (ref <200)
HDL: 59 mg/dL (ref >=50)
LDL Cholesterol (Calc): 120 mg/dL (calc) — ABNORMAL HIGH
Non-HDL Cholesterol (Calc): 152 mg/dL (calc) — ABNORMAL HIGH (ref <130)
Total CHOL/HDL Ratio: 3.6 (calc) (ref <5.0)
Triglycerides: 200 mg/dL — ABNORMAL HIGH (ref <150)

## 2022-08-15 LAB — CBC
HCT: 40.5 % (ref 35.0–45.0)
Hemoglobin: 13.7 g/dL (ref 11.7–15.5)
MCH: 30.3 pg (ref 27.0–33.0)
MCHC: 33.8 g/dL (ref 32.0–36.0)
MCV: 89.6 fL (ref 80.0–100.0)
MPV: 11.8 fL (ref 7.5–12.5)
Platelets: 230 10*3/uL (ref 140–400)
RBC: 4.52 10*6/uL (ref 3.80–5.10)
RDW: 14.1 % (ref 11.0–15.0)
WBC: 7.7 10*3/uL (ref 3.8–10.8)

## 2022-08-15 LAB — RPR: RPR Ser Ql: NONREACTIVE

## 2022-08-15 LAB — HIV-1 RNA QUANT-NO REFLEX-BLD
HIV 1 RNA Quant: NOT DETECTED Copies/mL
HIV-1 RNA Quant, Log: NOT DETECTED Log cps/mL

## 2023-01-14 ENCOUNTER — Ambulatory Visit (INDEPENDENT_AMBULATORY_CARE_PROVIDER_SITE_OTHER): Payer: 59 | Admitting: Internal Medicine

## 2023-01-14 ENCOUNTER — Other Ambulatory Visit: Payer: Self-pay

## 2023-01-14 ENCOUNTER — Encounter: Payer: Self-pay | Admitting: Internal Medicine

## 2023-01-14 VITALS — BP 133/80 | HR 75 | Temp 97.4°F | Ht 66.5 in | Wt 130.0 lb

## 2023-01-14 DIAGNOSIS — B20 Human immunodeficiency virus [HIV] disease: Secondary | ICD-10-CM

## 2023-01-14 MED ORDER — SYMTUZA 800-150-200-10 MG PO TABS: 1.0000 | ORAL_TABLET | Freq: Every day | ORAL | 11 refills | Status: DC

## 2023-01-14 NOTE — Progress Notes (Signed)
Patient Active Problem List   Diagnosis Date Noted   Anal intraepithelial neoplasia III (AIN III) 08/12/2022    Priority: High   Focal segmental glomerulosclerosis 05/28/2020    Priority: High   Human immunodeficiency virus (HIV) disease 07/23/2006    Priority: High   DYSLIPIDEMIA 02/19/2010    Priority: Medium    HYPERTENSION NEC 12/25/2009    Priority: Medium    CIGARETTE SMOKER 05/09/2008    Priority: Medium    ANXIETY 07/23/2006    Priority: Medium    Polysubstance abuse 07/23/2006    Priority: Medium    Depression 07/23/2006    Priority: Medium    DYSPLASIA, CERVIX NOS 07/23/2006    Priority: Medium    Enlarged pituitary gland 08/07/2021   Bruising 02/07/2020   GLAUCOMA 05/09/2008   APPENDECTOMY, HX OF 12/02/2006   GENITAL HERPES 07/23/2006   HERPES SIMPLEX KERATITIS 07/23/2006   WARTS, OTHER SPECIFIED VIRAL 07/23/2006   SYPHILIS 07/23/2006   ANEMIA-NOS 07/23/2006   PNEUMONIA, RECURRENT 07/23/2006   DENTAL CARIES 07/23/2006    Patient's Medications  New Prescriptions   No medications on file  Previous Medications   ALBUTEROL (VENTOLIN HFA) 108 (90 BASE) MCG/ACT INHALER    Inhale 2 puffs into the lungs every 4 (four) hours as needed.   BIMATOPROST (LUMIGAN) 0.01 % SOLN    Place 1 drop into both eyes at bedtime.   CHOLECALCIFEROL (VITAMIN D3) 125 MCG (5000 UT) CHEW    Chew 5,000 Units by mouth daily.   COSYNTROPIN IJ    Inject into the vein.   FARXIGA 10 MG TABS TABLET    Take 10 mg by mouth daily.   FERROUS SULFATE 325 (65 FE) MG TABLET    Take 325 mg by mouth daily with breakfast.   FISH OIL-OMEGA-3 FATTY ACIDS 1000 MG CAPSULE    Take 1 g by mouth daily.   FLUOCINOLONE ACETONIDE BODY 0.01 % OIL    Apply 1 application topically 2 (two) times a week.   HALOBETASOL (ULTRAVATE) 0.05 % CREAM    1(one) application(s) topical 2(two) times a day   HYDROCORTISONE (ANUSOL-HC) 2.5 % RECTAL CREAM    Place 1 application rectally daily as needed for hemorrhoids  or anal itching.    HYDROXYCHLOROQUINE (PLAQUENIL) 200 MG TABLET    Take 200 mg by mouth daily.   MULTIPLE VITAMINS-MINERALS (MULTIVITAMIN WITH MINERALS) TABLET    Take 1 tablet by mouth daily.   OLOPATADINE HCL (PATADAY) 0.2 % SOLN    Place one drop into both eyes every morning.   OMEGA-3 ACID ETHYL ESTERS (LOVAZA) 1 G CAPSULE    Take 1 g by mouth daily.   PAZEO 0.7 % SOLN    Place 1 drop into both eyes daily.   PRAVASTATIN (PRAVACHOL) 40 MG TABLET    Take 40 mg by mouth daily.    PREDNISONE (DELTASONE) 20 MG TABLET    Take 20 mg by mouth daily with breakfast.   SILVER SULFADIAZINE (SILVADENE) 1 % CREAM    Apply 1 application topically daily as needed (itching).    TELMISARTAN (MICARDIS) 20 MG TABLET    Take 20 mg by mouth daily.   TURMERIC 500 MG CAPS    Take 1,000 mg by mouth daily.   VALACYCLOVIR (VALTREX) 1000 MG TABLET    Take 1/2 tablet (500 mg total) by mouth 2 (two) times daily. for 5 days as needed.  Modified Medications   Modified Medication Previous  Medication   DARUNAVIR-COBICISTAT-EMTRICITABINE-TENOFOVIR ALAFENAMIDE (SYMTUZA) 800-150-200-10 MG TABS Darunavir-Cobicistat-Emtricitabine-Tenofovir Alafenamide (SYMTUZA) 800-150-200-10 MG TABS      Take 1 tablet by mouth daily with breakfast.    Take 1 tablet by mouth daily with breakfast.  Discontinued Medications   No medications on file    Subjective: Kara Munoz is in for her routine HIV follow-up visit.  She has not had any problems obtaining, taking or tolerating Symtuza and does not think she has been missing doses.  She is feeling well.  Review of Systems: Review of Systems  Constitutional:  Negative for fever and weight loss.    Past Medical History:  Diagnosis Date   Depression    HIV infection    Hyperlipidemia     Social History   Tobacco Use   Smoking status: Light Smoker    Packs/day: 0.10    Years: 35.00    Additional pack years: 0.00    Total pack years: 3.50    Types: Cigarettes   Smokeless tobacco:  Never   Tobacco comments:    1 pack per week  Substance Use Topics   Alcohol use: No    Alcohol/week: 0.0 standard drinks of alcohol   Drug use: No    Comment: recovering, no now    No family history on file.  Allergies  Allergen Reactions   Augmentin [Amoxicillin-Pot Clavulanate] Diarrhea    Severe - Pt request it be listed as an allergy augmentin    Health Maintenance  Topic Date Due   COLONOSCOPY (Pts 45-50yrs Insurance coverage will need to be confirmed)  Never done   MAMMOGRAM  Never done   DEXA SCAN  Never done   Pneumonia Vaccine 28+ Years old (3 of 3 - PPSV23 or PCV20) 02/10/2020   COVID-19 Vaccine (6 - 2023-24 season) 10/17/2022   Medicare Annual Wellness (AWV)  03/19/2023   INFLUENZA VACCINE  05/14/2023   DTaP/Tdap/Td (2 - Td or Tdap) 10/23/2030   Hepatitis C Screening  Completed   Zoster Vaccines- Shingrix  Completed   HPV VACCINES  Aged Out    Objective:  Vitals:   01/14/23 0937  BP: 133/80  Pulse: 75  Temp: (!) 97.4 F (36.3 C)  TempSrc: Temporal  SpO2: 99%  Weight: 130 lb (59 kg)  Height: 5' 6.5" (1.689 m)   Body mass index is 20.67 kg/m.  Physical Exam Constitutional:      Comments: She is talkative and in very good spirits as usual.  Cardiovascular:     Rate and Rhythm: Normal rate and regular rhythm.     Heart sounds: No murmur heard. Pulmonary:     Effort: Pulmonary effort is normal.     Breath sounds: Normal breath sounds.  Psychiatric:        Mood and Affect: Mood normal.     Lab Results Lab Results  Component Value Date   WBC 7.7 08/13/2022   HGB 13.7 08/13/2022   HCT 40.5 08/13/2022   MCV 89.6 08/13/2022   PLT 230 08/13/2022    Lab Results  Component Value Date   CREATININE 1.33 (H) 08/13/2022   BUN 17 08/13/2022   NA 147 (H) 08/13/2022   K 3.9 08/13/2022   CL 112 (H) 08/13/2022   CO2 28 08/13/2022    Lab Results  Component Value Date   ALT 14 08/13/2022   AST 19 08/13/2022   ALKPHOS 84 03/26/2017   BILITOT  0.5 08/13/2022    Lab Results  Component Value Date   CHOL  211 (H) 08/13/2022   HDL 59 08/13/2022   LDLCALC 120 (H) 08/13/2022   TRIG 200 (H) 08/13/2022   CHOLHDL 3.6 08/13/2022   Lab Results  Component Value Date   LABRPR NON-REACTIVE 08/13/2022   HIV 1 RNA Quant (Copies/mL)  Date Value  08/13/2022 Not Detected  08/08/2021 Not Detected  08/08/2020 <20   CD4 T Cell Abs (/uL)  Date Value  08/13/2022 673  08/08/2021 481  08/08/2020 679     Problem List Items Addressed This Visit       High   Human immunodeficiency virus (HIV) disease - Primary    Her infection has been under excellent, long-term control.  I will update her blood work today.  She will continue Symtuza and follow-up in 6 months.      Relevant Medications   Darunavir-Cobicistat-Emtricitabine-Tenofovir Alafenamide (SYMTUZA) 800-150-200-10 MG TABS   Other Relevant Orders   T-helper cells (CD4) count (not at [facility])   HIV-1 RNA quant-no reflex-bld   CBC   Comprehensive metabolic panel   RPR   Lipid panel      Michel Bickers, MD [facility] [phone] pager   [phone] cell 01/14/2023, 10:05 AM

## 2023-01-14 NOTE — Assessment & Plan Note (Signed)
Her infection has been under excellent, long-term control.  I will update her blood work today.  She will continue Symtuza and follow-up in 6 months.

## 2023-01-15 ENCOUNTER — Telehealth: Payer: Self-pay

## 2023-01-15 LAB — T-HELPER CELLS (CD4) COUNT (NOT AT ARMC)
CD4 % Helper T Cell: 35 % (ref 33–65)
CD4 T Cell Abs: 519 /uL (ref 400–1790)

## 2023-01-15 NOTE — Telephone Encounter (Signed)
Patient called requesting to discuss lab results as she is concerned about elevated lipids.   Advised her to contact PCP to discuss elevated lipids and elevated creatinine.   We discussed that her CD4 count is healthy at over 500 and viral load is still processing.   She requested labs be shared with her PCP Dr. Vira Blanco and nephrologist Dr. Candiss Norse.   Labs routed to requested providers.   Beryle Flock, RN

## 2023-01-16 LAB — COMPREHENSIVE METABOLIC PANEL
AG Ratio: 1.7 (calc) (ref 1.0–2.5)
ALT: 12 U/L (ref 6–29)
AST: 21 U/L (ref 10–35)
Albumin: 4.2 g/dL (ref 3.6–5.1)
Alkaline phosphatase (APISO): 71 U/L (ref 37–153)
BUN/Creatinine Ratio: 14 (calc) (ref 6–22)
BUN: 20 mg/dL (ref 7–25)
CO2: 26 mmol/L (ref 20–32)
Calcium: 10.3 mg/dL (ref 8.6–10.4)
Chloride: 108 mmol/L (ref 98–110)
Creat: 1.42 mg/dL — ABNORMAL HIGH (ref 0.50–1.05)
Globulin: 2.5 g/dL (calc) (ref 1.9–3.7)
Glucose, Bld: 79 mg/dL (ref 65–99)
Potassium: 4.4 mmol/L (ref 3.5–5.3)
Sodium: 143 mmol/L (ref 135–146)
Total Bilirubin: 0.5 mg/dL (ref 0.2–1.2)
Total Protein: 6.7 g/dL (ref 6.1–8.1)

## 2023-01-16 LAB — LIPID PANEL
Cholesterol: 227 mg/dL — ABNORMAL HIGH (ref <200)
HDL: 66 mg/dL (ref >=50)
LDL Cholesterol (Calc): 127 mg/dL (calc) — ABNORMAL HIGH
Non-HDL Cholesterol (Calc): 161 mg/dL (calc) — ABNORMAL HIGH (ref <130)
Total CHOL/HDL Ratio: 3.4 (calc) (ref <5.0)
Triglycerides: 204 mg/dL — ABNORMAL HIGH (ref <150)

## 2023-01-16 LAB — RPR: RPR Ser Ql: NONREACTIVE

## 2023-01-16 LAB — CBC
HCT: 44 % (ref 35.0–45.0)
Hemoglobin: 14.3 g/dL (ref 11.7–15.5)
MCH: 29.5 pg (ref 27.0–33.0)
MCHC: 32.5 g/dL (ref 32.0–36.0)
MCV: 90.7 fL (ref 80.0–100.0)
MPV: 11.6 fL (ref 7.5–12.5)
Platelets: 226 10*3/uL (ref 140–400)
RBC: 4.85 10*6/uL (ref 3.80–5.10)
RDW: 13.9 % (ref 11.0–15.0)
WBC: 5.4 10*3/uL (ref 3.8–10.8)

## 2023-01-16 LAB — HIV-1 RNA QUANT-NO REFLEX-BLD
HIV 1 RNA Quant: 32 Copies/mL — ABNORMAL HIGH
HIV-1 RNA Quant, Log: 1.5 Log cps/mL — ABNORMAL HIGH

## 2023-02-18 ENCOUNTER — Ambulatory Visit: Payer: Medicare Other | Admitting: Internal Medicine

## 2023-07-28 ENCOUNTER — Ambulatory Visit: Payer: 59 | Admitting: Infectious Diseases

## 2023-08-03 ENCOUNTER — Other Ambulatory Visit: Payer: Self-pay

## 2023-08-03 ENCOUNTER — Encounter: Payer: Self-pay | Admitting: Infectious Diseases

## 2023-08-03 ENCOUNTER — Ambulatory Visit (INDEPENDENT_AMBULATORY_CARE_PROVIDER_SITE_OTHER): Payer: Medicare HMO | Admitting: Infectious Diseases

## 2023-08-03 VITALS — BP 151/77 | HR 73 | Resp 16 | Ht 66.5 in | Wt 124.0 lb

## 2023-08-03 DIAGNOSIS — B20 Human immunodeficiency virus [HIV] disease: Secondary | ICD-10-CM | POA: Diagnosis not present

## 2023-08-03 DIAGNOSIS — D013 Carcinoma in situ of anus and anal canal: Secondary | ICD-10-CM | POA: Diagnosis not present

## 2023-08-03 DIAGNOSIS — F4321 Adjustment disorder with depressed mood: Secondary | ICD-10-CM | POA: Diagnosis not present

## 2023-08-03 MED ORDER — ENSURE PO LIQD: 237.0000 mL | Freq: Two times a day (BID) | ORAL | 11 refills | Status: AC

## 2023-08-03 MED ORDER — SYMTUZA 800-150-200-10 MG PO TABS
1.0000 | ORAL_TABLET | Freq: Every day | ORAL | 11 refills | Status: DC
Start: 2023-08-03 — End: 2024-03-21

## 2023-08-03 NOTE — Patient Instructions (Addendum)
So nice to meet you today - looking forward to taking care of you!   Will get you referred to [facility] to help with Ensure supplements   Try some ice for your elbow for 15 minutes at night.   [facility]  [address] [place], [place] [address] [phone]  Return in about 6 months (around 02/01/2024).

## 2023-08-03 NOTE — Progress Notes (Signed)
Name: Kara Munoz  DOB: 10-10-1956 MRN: [id number] PCP: Ofilia Neas, MD    Patient Active Problem List   Diagnosis Date Noted   Grief reaction 08/03/2023   Anal intraepithelial neoplasia III (AIN III) 08/12/2022   Enlarged pituitary gland (HCC) 08/07/2021   Focal segmental glomerulosclerosis 05/28/2020   Bruising 02/07/2020   DYSLIPIDEMIA 02/19/2010   HYPERTENSION NEC 12/25/2009   CIGARETTE SMOKER 05/09/2008   GLAUCOMA 05/09/2008   APPENDECTOMY, HX OF 12/02/2006   Human immunodeficiency virus (HIV) disease (HCC) 07/23/2006   GENITAL HERPES 07/23/2006   HERPES SIMPLEX KERATITIS 07/23/2006   WARTS, OTHER SPECIFIED VIRAL 07/23/2006   SYPHILIS 07/23/2006   ANEMIA-NOS 07/23/2006   ANXIETY 07/23/2006   Polysubstance abuse (HCC) 07/23/2006   Depression 07/23/2006   DENTAL CARIES 07/23/2006   DYSPLASIA, CERVIX NOS 07/23/2006     Brief Narrative:  Kara Munoz is a 67 y.o. female with well controlled HIV, Stage unknown at Dx in 2000 CD4 nadir unknown VL unknown HIV Risk: sexual History of OIs: none known Intake Labs: Hep B sAg (-), sAb (-), cAb (-); Hep A (-), Hep C (-) Quantiferon (-) HLA B*5701 (- 2015) G6PD: ()   Previous Regimens: 2012 - Kaletra + Truvada  2014 - Reyataz + Truvada (simpler regimen, less s/e) 2015 - Triumeq + Evotaz (mild renal insufficiency)  2019 - Symtuza  Genotypes:   Subjective:   Chief Complaint  Patient presents with   Follow-up     HPI: [name] is here for HIV follow up care. She is a new patient to me as she was working exclusively with Dr. Orvan Falconer since 2000 at initial diagnosis. She   Last CD4 was 519 in April (she ranges between 500 - 800 in looking back at years of values). Her viral load was undetectable.She continues to take her [place] everyday.   Since our last OV with her, she has had a significant loss - her partner of 23 years passed away suddenly in a car accident. She has had significant grief with this.  Thought about using cocaine but did not and she is so proud of herself. Even during these tough times she has continued her Symtuza. Needs to have it sent to mail order pharmacy to help with arranging it to be sent without delay.   She has had a few falls lately - diagnosed with elbow tendonitis of the left side. Aches a lot at night.   She has a FU vaginal pap smear with gyn team (h/o hysterectomy for cancer concerns). She had vaginitis swab collected 35m ago and was negative for concerns.      08/03/2023    9:43 AM  Depression screen PHQ 2/9  Decreased Interest 0  Down, Depressed, Hopeless 0  PHQ - 2 Score 0    Review of Systems  Constitutional:  Negative for chills and fever.  HENT:  Negative for tinnitus.   Eyes:  Negative for blurred vision and photophobia.  Respiratory:  Negative for cough and sputum production.   Cardiovascular:  Negative for chest pain.  Gastrointestinal:  Negative for diarrhea, nausea and vomiting.  Genitourinary:  Negative for dysuria.  Skin:  Negative for rash.  Neurological:  Negative for headaches.  Psychiatric/Behavioral:  Positive for depression.     Past Medical History:  Diagnosis Date   Depression    HIV infection (HCC)    Hyperlipidemia     Outpatient Medications Prior to Visit  Medication Sig Dispense Refill   B Complex-C (SUPER B COMPLEX  PO) Take by mouth.     bimatoprost (LUMIGAN) 0.01 % SOLN Place 1 drop into both eyes at bedtime.     Biotin 16109 MCG TABS Take by mouth.     Cholecalciferol (VITAMIN D3) 125 MCG (5000 UT) CHEW Chew 5,000 Units by mouth daily.     COSYNTROPIN IJ Inject into the vein.     empagliflozin (JARDIANCE) 10 MG TABS tablet Take by mouth daily.     ferrous sulfate 325 (65 FE) MG tablet Take 325 mg by mouth daily with breakfast.     Fluocinolone Acetonide Body 0.01 % OIL Apply 1 application topically 2 (two) times a week.     hydroxychloroquine (PLAQUENIL) 200 MG tablet Take 200 mg by mouth daily.      levocetirizine (XYZAL) 5 MG tablet Take 5 mg by mouth every evening.     Multiple Vitamins-Minerals (MULTIVITAMIN WITH MINERALS) tablet Take 1 tablet by mouth daily.     Olopatadine HCl (PATADAY) 0.2 % SOLN Place one drop into both eyes every morning.     omega-3 acid ethyl esters (LOVAZA) 1 g capsule Take 1 g by mouth daily.     PAZEO 0.7 % SOLN Place 1 drop into both eyes daily.  5   pravastatin (PRAVACHOL) 40 MG tablet Take 40 mg by mouth daily.      telmisartan (MICARDIS) 20 MG tablet Take 20 mg by mouth daily.     Turmeric 500 MG CAPS Take 1,000 mg by mouth daily.     valACYclovir (VALTREX) 1000 MG tablet Take 1/2 tablet (500 mg total) by mouth 2 (two) times daily. for 5 days as needed. (Patient taking differently: Take 500 mg by mouth daily.) 20 tablet 5   VITAMIN D-VITAMIN K PO Take 5,000 mcg by mouth. 90 mcg / 125 mcg     Darunavir-Cobicistat-Emtricitabine-Tenofovir Alafenamide (SYMTUZA) 800-150-200-10 MG TABS Take 1 tablet by mouth daily with breakfast. 30 tablet 11   albuterol (VENTOLIN HFA) 108 (90 Base) MCG/ACT inhaler Inhale 2 puffs into the lungs every 4 (four) hours as needed. (Patient not taking: Reported on 08/03/2023)     FARXIGA 10 MG TABS tablet Take 10 mg by mouth daily. (Patient not taking: Reported on 08/03/2023)     halobetasol (ULTRAVATE) 0.05 % cream 1(one) application(s) topical 2(two) times a day (Patient not taking: Reported on 08/03/2023)     hydrocortisone (ANUSOL-HC) 2.5 % rectal cream Place 1 application rectally daily as needed for hemorrhoids or anal itching.  (Patient not taking: Reported on 08/03/2023)     predniSONE (DELTASONE) 20 MG tablet Take 20 mg by mouth daily with breakfast. (Patient not taking: Reported on 08/03/2023)     silver sulfADIAZINE (SILVADENE) 1 % cream Apply 1 application topically daily as needed (itching).  (Patient not taking: Reported on 08/03/2023)     fish oil-omega-3 fatty acids 1000 MG capsule Take 1 g by mouth daily. (Patient not  taking: Reported on 08/03/2023)     No facility-administered medications prior to visit.     Allergies  Allergen Reactions   Augmentin [Amoxicillin-Pot Clavulanate] Diarrhea    Severe - Pt request it be listed as an allergy augmentin    Social History   Tobacco Use   Smoking status: Light Smoker    Current packs/day: 0.10    Average packs/day: 0.1 packs/day for 35.0 years (3.5 ttl pk-yrs)    Types: Cigarettes   Smokeless tobacco: Never   Tobacco comments:    1 pack per week  Substance Use Topics  Alcohol use: No    Alcohol/week: 0.0 standard drinks of alcohol   Drug use: No    Comment: recovering, no now    No family history on file.  Social History   Substance and Sexual Activity  Sexual Activity Not Currently   Partners: Male   Comment: declined condoms     Objective:   Vitals:   08/03/23 0943 08/03/23 0951  BP: (!) 154/78 (!) 151/77  Pulse: 73   Resp: 16   Weight: 124 lb (56.2 kg)   Height: 5' 6.5" (1.689 m)    Body mass index is 19.71 kg/m.  Physical Exam Constitutional:      Appearance: Normal appearance. She is not ill-appearing.  HENT:     Mouth/Throat:     Mouth: Mucous membranes are moist.     Pharynx: Oropharynx is clear.  Eyes:     General: No scleral icterus. Cardiovascular:     Rate and Rhythm: Normal rate and regular rhythm.  Pulmonary:     Effort: Pulmonary effort is normal.  Musculoskeletal:        General: Normal range of motion.  Skin:    General: Skin is warm and dry.  Neurological:     Mental Status: She is oriented to person, place, and time.  Psychiatric:        Mood and Affect: Mood normal.        Thought Content: Thought content normal.     Lab Results Lab Results  Component Value Date   WBC 5.4 01/14/2023   HGB 14.3 01/14/2023   HCT 44.0 01/14/2023   MCV 90.7 01/14/2023   PLT 226 01/14/2023    Lab Results  Component Value Date   CREATININE 1.42 (H) 01/14/2023   BUN 20 01/14/2023   NA 143 01/14/2023    K 4.4 01/14/2023   CL 108 01/14/2023   CO2 26 01/14/2023    Lab Results  Component Value Date   ALT 12 01/14/2023   AST 21 01/14/2023   ALKPHOS 84 03/26/2017   BILITOT 0.5 01/14/2023    Lab Results  Component Value Date   CHOL 227 (H) 01/14/2023   HDL 66 01/14/2023   LDLCALC 127 (H) 01/14/2023   TRIG 204 (H) 01/14/2023   CHOLHDL 3.4 01/14/2023   HIV 1 RNA Quant (Copies/mL)  Date Value  01/14/2023 32 (H)  08/13/2022 Not Detected  08/08/2021 Not Detected   CD4 T Cell Abs (/uL)  Date Value  01/14/2023 519  08/13/2022 673  08/08/2021 481     Assessment & Plan:   Problem List Items Addressed This Visit       Unprioritized   Human immunodeficiency virus (HIV) disease (HCC)    I reviewed the available records in our system since 2000 - she has been on a variety of regimens in the past all due to natural evolution of newer safer medications, I see no concerns for resistance in the past documented. Will continue Symtuza for her and update labs today. She has had excellent long term control over HIV infection and good immunologic recovery over the years.  I am confident this will continue to be the case.  Rx sent to requested mail order pharmacy.  [facility]P referral for nutritional supplementation for Ensure BID at least.  Will get fasting lipid next OV and see if she is interested in statin for primary cardiac prevention.  PAP smear FU with GYN team (scheduled soon).  A1C screen next OV if not done recently.  Flu  shot today.         Relevant Medications   Darunavir-Cobicistat-Emtricitabine-Tenofovir Alafenamide (SYMTUZA) 800-150-200-10 MG TABS   Other Relevant Orders   AMB REFERRAL TO [facility]   HIV 1 RNA quant-no reflex-bld   T-helper cells (CD4) count   Grief reaction    Discussed her loss of SO - tough family dynamics unfortunately confounding her grief.  Referred to [facility] for Grief Counseling - she has their number and will consider.         Anal intraepithelial neoplasia III (AIN III) - Primary    FU with Dr. Richarda Overlie       Total time spent including face to face time and professional time spent in chart reviewing medical records 42 minutes    Rexene Alberts, MSN, NP-C [facility] for Infectious Disease [facility] Pager: [phone] Office: [phone]  08/03/23  3:14 PM

## 2023-08-03 NOTE — Assessment & Plan Note (Signed)
Discussed her loss of SO - tough family dynamics unfortunately confounding her grief.  Referred to [facility] for Grief Counseling - she has their number and will consider.

## 2023-08-03 NOTE — Assessment & Plan Note (Signed)
FU with Dr. Richarda Overlie

## 2023-08-03 NOTE — Assessment & Plan Note (Signed)
I reviewed the available records in our system since 2000 - she has been on a variety of regimens in the past all due to natural evolution of newer safer medications, I see no concerns for resistance in the past documented. Will continue Symtuza for her and update labs today. She has had excellent long term control over HIV infection and good immunologic recovery over the years.  I am confident this will continue to be the case.  Rx sent to requested mail order pharmacy.  THP referral for nutritional supplementation for Ensure BID at least.  Will get fasting lipid next OV and see if she is interested in statin for primary cardiac prevention.  PAP smear FU with GYN team (scheduled soon).  A1C screen next OV if not done recently.  Flu shot today.

## 2023-08-04 LAB — T-HELPER CELLS (CD4) COUNT (NOT AT ARMC)
CD4 % Helper T Cell: 34 % (ref 33–65)
CD4 T Cell Abs: 677 /uL (ref 400–1790)

## 2023-08-05 LAB — HIV-1 RNA QUANT-NO REFLEX-BLD
HIV 1 RNA Quant: <20 {copies}/mL — ABNORMAL HIGH
HIV-1 RNA Quant, Log: <1.3 {Log_copies}/mL — ABNORMAL HIGH

## 2023-09-14 NOTE — Telephone Encounter (Signed)
Spoke with Roanne, she states she used to work with [employer] but they dismissed her since her viral load is undetectable and she has transportation.   Sandie Ano, RN

## 2023-09-14 NOTE — Telephone Encounter (Signed)
Called [employer] on three separate extensions, unable to get an answer.   Called Naavah and discussed the possibility of an appetite stimulant. She is interested as long as it would not affect her kidney health. Reports that lack of appetite is her biggest issue. She gets monthly food deliveries and uses food stamps. Denies and trouble with accessing food.   If an appetite stimulant is sent, she uses Nurse, learning disability in [place] ([place]: [phone]).   Reports her previous case manager with [employer] was Jazmine. Called Jazmine's line at [employer] to try and coordinate assistance with Ensure, no answer and unable to leave a message.   Sandie Ano, RN

## 2023-09-15 MED ORDER — MIRTAZAPINE 7.5 MG PO TABS: 7.5000 mg | ORAL_TABLET | Freq: Every day | ORAL | 2 refills | Status: DC

## 2023-09-15 NOTE — Addendum Note (Signed)
Addended by: Blanchard Kelch on: 09/15/2023 02:07 PM   Modules accepted: Orders

## 2023-09-15 NOTE — Telephone Encounter (Signed)
Spoke with Dois Davenport and discussed new prescription for mirtazapine. Emphasized that it could make her sleepy so Judeth Cornfield recommends taking it around bedtime.   Offered to schedule 2 month virtual follow up, she is away from home right now and does not have her calendar. Requested a call back for scheduling tomorrow afternoon.   Tried reaching [employer] again, unable to get through on Jazmine's line.Left a voicemail for Supportive Services Director, Renelda Mom, requesting call back.   Sandie Ano, RN

## 2023-09-15 NOTE — Telephone Encounter (Signed)
I sent in a prescription for Mirtazapine 7.5 mg at bedtime to start for her.  I really love this medicine at lower doses for appetite stimulant - it does not have the side effects like megace and should work well. It has the potential to help her sleep better too (darn) so I would get in the habit of taking it in the evening.   We can certainly increase the dose if she finds it helpful, but if she does well with the lower dose that's great too!   We can arrange a virtual follow up in 2 months or so to see if she has had some benefit and talk about dose effect then.   Do you mind calling her back? Thank you!

## 2023-09-18 NOTE — Telephone Encounter (Signed)
Crystal with [employer] called back, Shamila was discharged from medical case management due to not meeting the acuity score set by the state for Medical Case Management Assessment.   Sandie Ano, RN

## 2023-11-17 ENCOUNTER — Other Ambulatory Visit: Payer: Self-pay

## 2023-11-17 ENCOUNTER — Encounter: Payer: Self-pay | Admitting: Infectious Diseases

## 2023-11-17 ENCOUNTER — Telehealth (INDEPENDENT_AMBULATORY_CARE_PROVIDER_SITE_OTHER): Payer: Medicare HMO | Admitting: Infectious Diseases

## 2023-11-17 DIAGNOSIS — R630 Anorexia: Secondary | ICD-10-CM | POA: Insufficient documentation

## 2023-11-17 DIAGNOSIS — B20 Human immunodeficiency virus [HIV] disease: Secondary | ICD-10-CM | POA: Diagnosis not present

## 2023-11-17 MED ORDER — MIRTAZAPINE 7.5 MG PO TABS: 7.5000 mg | ORAL_TABLET | Freq: Every day | ORAL | 11 refills | Status: DC

## 2023-11-17 NOTE — Progress Notes (Signed)
 Name: Kara Munoz  DOB: Jan 19, 1956 MRN: [id number] PCP: Ernesto Elspeth PARAS, MD   VIRTUAL CARE ENCOUNTER  I connected with Kara Munoz on 11/17/23 at  3:00 PM EST by VIDEO and verified that I am speaking with the correct person using two identifiers.   I discussed the limitations, risks, security and privacy concerns of performing an evaluation and management service by telephone and the availability of in person appointments. I also discussed with the patient that there may be a patient responsible charge related to this service. The patient expressed understanding and agreed to proceed.  Patient Location: [facility]   Other Participants:   Provider Location: [facility] Office    Brief Narrative:  Kara Munoz is a 68 y.o. female with well controlled HIV, Stage unknown at Dx in 2000 CD4 nadir unknown VL unknown HIV Risk: sexual History of OIs: none known Intake Labs: Hep B sAg (-), sAb (-), cAb (-); Hep A (-), Hep C (-) Quantiferon (-) HLA B*5701 (- 2015) G6PD: ()   Previous Regimens: 2012 - Kaletra  + Truvada  2014 - Reyataz  + Truvada (simpler regimen, less s/e) 2015 - Triumeq  + Evotaz  (mild renal insufficiency)  2019 - Symtuza   Genotypes: None on file   Subjective   Subjective:   Chief Complaint  Patient presents with   Telehealth Consent     Discussed the use of AI scribe software for clinical note transcription with the patient, who gave verbal consent to proceed.   History of Present Illness   Kara Munoz is a 68 year old female who presents for a follow-up regarding her medication management and overall health status.  She is currently taking mirtazapine , pravastatin , and levocetirizine. She uses clobetasol  for a skin condition and takes iron tablets as part of her vitamin regimen. She uses Flonase  nasal spray for allergies and is no longer taking prednisone  or Farxiga.  She currently weighs 130 pounds and aims to reach 150 pounds. She attributes  her improved health and strength to her current medication regimen, which she finds effective with minimal side effects.  Her mood has improved as she moves through grief; she engages in activities that bring her joy and support her mental health. She emphasizes the importance of social connections in maintaining her mental well-being.  She discusses the importance of regular health screenings and lifestyle modifications.          11/17/2023    2:36 PM  Depression screen PHQ 2/9  Decreased Interest 0  Down, Depressed, Hopeless 0  PHQ - 2 Score 0    Review of Systems  Constitutional:  Negative for chills and fever.  HENT:  Negative for tinnitus.   Eyes:  Negative for blurred vision and photophobia.  Respiratory:  Negative for cough and sputum production.   Cardiovascular:  Negative for chest pain.  Gastrointestinal:  Negative for diarrhea, nausea and vomiting.  Genitourinary:  Negative for dysuria.  Skin:  Negative for rash.  Neurological:  Negative for headaches.  Psychiatric/Behavioral:  Positive for depression.     Past Medical History:  Diagnosis Date   Depression    HIV infection (HCC)    Hyperlipidemia     Outpatient Medications Prior to Visit  Medication Sig Dispense Refill   B Complex-C (SUPER B COMPLEX PO) Take by mouth.     bimatoprost  (LUMIGAN ) 0.01 % SOLN Place 1 drop into both eyes at bedtime.     Biotin 89999 MCG TABS Take by mouth.     Cholecalciferol (VITAMIN  D3) 125 MCG (5000 UT) CHEW Chew 5,000 Units by mouth daily.     Darunavir-Cobicistat -Emtricitabine -Tenofovir  Alafenamide (SYMTUZA ) 800-150-200-10 MG TABS Take 1 tablet by mouth daily with breakfast. 30 tablet 11   empagliflozin (JARDIANCE) 10 MG TABS tablet Take by mouth daily.     Ensure (ENSURE) Take 237 mLs by mouth 2 (two) times daily between meals. 237 mL 11   ferrous sulfate 325 (65 FE) MG tablet Take 325 mg by mouth daily with breakfast.     hydroxychloroquine (PLAQUENIL) 200 MG tablet Take 200  mg by mouth daily.     levocetirizine (XYZAL) 5 MG tablet Take 5 mg by mouth every evening.     Multiple Vitamins-Minerals (MULTIVITAMIN WITH MINERALS) tablet Take 1 tablet by mouth daily.     Olopatadine  HCl (PATADAY ) 0.2 % SOLN Place one drop into both eyes every morning.     omega-3 acid ethyl esters (LOVAZA) 1 g capsule Take 1 g by mouth daily.     PAZEO 0.7 % SOLN Place 1 drop into both eyes daily.  5   pravastatin  (PRAVACHOL ) 40 MG tablet Take 40 mg by mouth daily.      telmisartan (MICARDIS) 20 MG tablet Take 20 mg by mouth daily.     Turmeric 500 MG CAPS Take 1,000 mg by mouth daily.     valACYclovir  (VALTREX ) 1000 MG tablet Take 1/2 tablet (500 mg total) by mouth 2 (two) times daily. for 5 days as needed. 20 tablet 5   VITAMIN D-VITAMIN K PO Take 5,000 mcg by mouth. 90 mcg / 125 mcg     COSYNTROPIN IJ Inject into the vein.     mirtazapine  (REMERON ) 7.5 MG tablet Take 1 tablet (7.5 mg total) by mouth at bedtime. 30 tablet 2   albuterol  (VENTOLIN  HFA) 108 (90 Base) MCG/ACT inhaler Inhale 2 puffs into the lungs every 4 (four) hours as needed. (Patient not taking: Reported on 08/03/2023)     halobetasol (ULTRAVATE) 0.05 % cream 1(one) application(s) topical 2(two) times a day (Patient not taking: Reported on 11/17/2023)     hydrocortisone  (ANUSOL -HC) 2.5 % rectal cream Place 1 application rectally daily as needed for hemorrhoids or anal itching.  (Patient not taking: Reported on 08/03/2023)     silver  sulfADIAZINE  (SILVADENE ) 1 % cream Apply 1 application topically daily as needed (itching).  (Patient not taking: Reported on 08/03/2023)     FARXIGA 10 MG TABS tablet Take 10 mg by mouth daily. (Patient not taking: Reported on 11/17/2023)     Fluocinolone  Acetonide Body 0.01 % OIL Apply 1 application topically 2 (two) times a week. (Patient not taking: Reported on 11/17/2023)     predniSONE  (DELTASONE ) 20 MG tablet Take 20 mg by mouth daily with breakfast. (Patient not taking: Reported on 11/17/2023)      No facility-administered medications prior to visit.     Allergies  Allergen Reactions   Augmentin  [Amoxicillin-Pot Clavulanate] Diarrhea    Severe - Pt request it be listed as an allergy augmentin     Social History   Tobacco Use   Smoking status: Light Smoker    Current packs/day: 0.10    Average packs/day: 0.1 packs/day for 35.0 years (3.5 ttl pk-yrs)    Types: Cigarettes   Smokeless tobacco: Never   Tobacco comments:    1 pack per week  Substance Use Topics   Alcohol use: No    Alcohol/week: 0.0 standard drinks of alcohol   Drug use: No    Comment: recovering, no now  History reviewed. No pertinent family history.  Social History   Substance and Sexual Activity  Sexual Activity Not Currently   Partners: Male   Comment: declined condoms     Objective   Objective:   There were no vitals filed for this visit.  There is no height or weight on file to calculate BMI.  Physical Exam Constitutional:      Appearance: Normal appearance. She is not ill-appearing.  HENT:     Mouth/Throat:     Mouth: Mucous membranes are moist.     Pharynx: Oropharynx is clear.  Eyes:     General: No scleral icterus. Cardiovascular:     Rate and Rhythm: Normal rate and regular rhythm.  Pulmonary:     Effort: Pulmonary effort is normal.  Musculoskeletal:        General: Normal range of motion.  Skin:    General: Skin is warm and dry.  Neurological:     Mental Status: She is oriented to person, place, and time.  Psychiatric:        Mood and Affect: Mood normal.        Thought Content: Thought content normal.     Lab Results Lab Results  Component Value Date   WBC 5.4 01/14/2023   HGB 14.3 01/14/2023   HCT 44.0 01/14/2023   MCV 90.7 01/14/2023   PLT 226 01/14/2023    Lab Results  Component Value Date   CREATININE 1.42 (H) 01/14/2023   BUN 20 01/14/2023   NA 143 01/14/2023   K 4.4 01/14/2023   CL 108 01/14/2023   CO2 26 01/14/2023    Lab Results   Component Value Date   ALT 12 01/14/2023   AST 21 01/14/2023   ALKPHOS 84 03/26/2017   BILITOT 0.5 01/14/2023    Lab Results  Component Value Date   CHOL 227 (H) 01/14/2023   HDL 66 01/14/2023   LDLCALC 127 (H) 01/14/2023   TRIG 204 (H) 01/14/2023   CHOLHDL 3.4 01/14/2023   HIV 1 RNA Quant (Copies/mL)  Date Value  08/03/2023 <20 (H)  01/14/2023 32 (H)  08/13/2022 Not Detected   CD4 T Cell Abs (/uL)  Date Value  08/03/2023 677  01/14/2023 519  08/13/2022 673       Assessment & Plan:     Weight Gain Patient reports successful weight gain, currently at 130 pounds with a goal of 150 pounds. No concerns or issues reported. -Continue current regimen and monitor progress.  Grief Reaction -  Patient reports good mood and has been using various coping mechanisms to manage stress and grief. No concerns or issues reported. -Continue current coping strategies and monitor mental health status.  Poor Appetite and Weight Loss -  Patient is on mirtazapine  with good response and minimal side effects. She is up to 140 lb now. She would like to continue this at current dose.  -Refill mirtazapine  prescription. -Review all medications during next visit in April.  General Health Maintenance Patient is due for a Pap smear and  -has FU with Gyn Onc   Follow-up Next appointment scheduled for April 2025. Patient to bring in nutritional supplements for review.      Meds ordered this encounter  Medications   mirtazapine  (REMERON ) 7.5 MG tablet    Sig: Take 1 tablet (7.5 mg total) by mouth at bedtime.    Dispense:  30 tablet    Refill:  11   No orders of the defined types were placed in this  encounter.  Next visit 02/08/2024   Corean Fireman, MSN, NP-C [facility] for Infectious Disease [facility] Pager: [phone] Office: [phone]  11/17/23  3:47 PM

## 2023-12-07 ENCOUNTER — Other Ambulatory Visit: Payer: Self-pay | Admitting: Infectious Diseases

## 2024-02-08 ENCOUNTER — Ambulatory Visit: Payer: Medicare HMO | Admitting: Infectious Diseases

## 2024-02-08 ENCOUNTER — Other Ambulatory Visit: Payer: Self-pay

## 2024-02-08 ENCOUNTER — Encounter: Payer: Self-pay | Admitting: Infectious Diseases

## 2024-02-08 VITALS — BP 126/82 | HR 77 | Ht 66.0 in | Wt 136.0 lb

## 2024-02-08 DIAGNOSIS — Z21 Asymptomatic human immunodeficiency virus [HIV] infection status: Secondary | ICD-10-CM

## 2024-02-08 DIAGNOSIS — Z1231 Encounter for screening mammogram for malignant neoplasm of breast: Secondary | ICD-10-CM

## 2024-02-08 DIAGNOSIS — F17200 Nicotine dependence, unspecified, uncomplicated: Secondary | ICD-10-CM | POA: Diagnosis not present

## 2024-02-08 DIAGNOSIS — B20 Human immunodeficiency virus [HIV] disease: Secondary | ICD-10-CM

## 2024-02-08 MED ORDER — MIRTAZAPINE 7.5 MG PO TABS: 7.5000 mg | ORAL_TABLET | Freq: Every day | ORAL | 11 refills | Status: DC

## 2024-02-08 MED ORDER — NICOTINE 21 MG/24HR TD PT24: 21.0000 mg | MEDICATED_PATCH | TRANSDERMAL | 0 refills | Status: AC

## 2024-02-08 NOTE — Patient Instructions (Signed)
 Please continue your Symtuza  and Mirtazapine  once a day.   Will arrange a video visit in 4-6 weeks to see how the patches are working to stop smoking

## 2024-02-08 NOTE — Progress Notes (Signed)
 Name: Kara Munoz  DOB: 1956-09-16 MRN: [id number] PCP: Michelle Aid, MD     Brief Narrative:  Kara Munoz is a 68 y.o. female with well controlled HIV, Stage unknown at Dx in 2000 CD4 nadir unknown VL unknown HIV Risk: sexual History of OIs: none known Intake Labs: Hep B sAg (-), sAb (-), cAb (-); Hep A (-), Hep C (-) Quantiferon (-) HLA B*5701 (- 2015) G6PD: ()   Previous Regimens: 2012 - Kaletra  + Truvada  2014 - Reyataz  + Truvada (simpler regimen, less s/e) 2015 - Triumeq  + Evotaz  (mild renal insufficiency)  2019 - Symtuza   Genotypes: None on file   Subjective   Subjective:   Chief Complaint  Patient presents with   Follow-up     Discussed the use of AI scribe software for clinical note transcription with the patient, who gave verbal consent to proceed.   History of Present Illness   Kara Munoz is a 68 year old female who presents for routine HIV follow up care and requesting smoking cessation support.  She experiences anxiety, particularly when driving on the highway, which has been ongoing since her son started driving. She feels anxious and depressed at times but finds solace in prayer. She has not pursued talk therapy or counseling. She is currently taking mirtazapine , which is working well for her appetite. She has two doses left and is due for a refill.  She is seeking assistance to quit smoking. She has previously used Nicorette gum, which was somewhat effective. She has a strong desire to quit smoking and has reduced her cigarette consumption significantly, sometimes buying individual cigarettes instead of packs. The morning cigarette is the hardest to give up. She goes to the gym daily to manage stress and mentions that her closest friends, who are also dealing with health issues, are supportive in her efforts to quit smoking. She has a friend who is also trying to quit, and she supports her during difficult moments.  Her past medical  history includes a history of undetectable viral loads and strong immune system levels, with CD4 counts consistently over 600. She is diligent about taking her medication and only missed a dose once.  She has a family history of colon cancer, as her mother died from it. She is under the care of a kidney specialist and had her kidney function last checked in July of the previous year.          02/08/2024    9:36 AM  Depression screen PHQ 2/9  Decreased Interest 0  Down, Depressed, Hopeless 0  PHQ - 2 Score 0  Altered sleeping 1  Tired, decreased energy 0  Change in appetite 0  Feeling bad or failure about yourself  1  Trouble concentrating 0  Moving slowly or fidgety/restless 0  Suicidal thoughts 0  PHQ-9 Score 2  Difficult doing work/chores Not difficult at all    Review of Systems  Constitutional:  Negative for chills and fever.  HENT:  Negative for tinnitus.   Eyes:  Negative for blurred vision and photophobia.  Respiratory:  Negative for cough and sputum production.   Cardiovascular:  Negative for chest pain.  Gastrointestinal:  Negative for diarrhea, nausea and vomiting.  Genitourinary:  Negative for dysuria.  Skin:  Negative for rash.  Neurological:  Negative for headaches.  Psychiatric/Behavioral:  Positive for depression.     Past Medical History:  Diagnosis Date   Depression    HIV infection (HCC)    Hyperlipidemia  Outpatient Medications Prior to Visit  Medication Sig Dispense Refill   albuterol  (VENTOLIN  HFA) 108 (90 Base) MCG/ACT inhaler Inhale 2 puffs into the lungs every 4 (four) hours as needed.     B Complex-C (SUPER B COMPLEX PO) Take by mouth.     bimatoprost  (LUMIGAN ) 0.01 % SOLN Place 1 drop into both eyes at bedtime.     Biotin 82956 MCG TABS Take by mouth.     Cholecalciferol (VITAMIN D3) 125 MCG (5000 UT) CHEW Chew 5,000 Units by mouth daily.     Darunavir-Cobicistat -Emtricitabine -Tenofovir  Alafenamide (SYMTUZA ) 800-150-200-10 MG TABS Take 1  tablet by mouth daily with breakfast. 30 tablet 11   empagliflozin (JARDIANCE) 10 MG TABS tablet Take by mouth daily.     Ensure (ENSURE) Take 237 mLs by mouth 2 (two) times daily between meals. 237 mL 11   ferrous sulfate 325 (65 FE) MG tablet Take 325 mg by mouth daily with breakfast.     halobetasol (ULTRAVATE) 0.05 % cream      hydrocortisone  (ANUSOL -HC) 2.5 % rectal cream Place 1 application  rectally daily as needed for hemorrhoids or anal itching.     hydroxychloroquine (PLAQUENIL) 200 MG tablet Take 200 mg by mouth daily.     levocetirizine (XYZAL) 5 MG tablet Take 5 mg by mouth every evening.     Multiple Vitamins-Minerals (MULTIVITAMIN WITH MINERALS) tablet Take 1 tablet by mouth daily.     Olopatadine  HCl (PATADAY ) 0.2 % SOLN Place one drop into both eyes every morning.     omega-3 acid ethyl esters (LOVAZA) 1 g capsule Take 1 g by mouth daily.     PAZEO 0.7 % SOLN Place 1 drop into both eyes daily.  5   pravastatin  (PRAVACHOL ) 40 MG tablet Take 40 mg by mouth daily.      silver  sulfADIAZINE  (SILVADENE ) 1 % cream Apply 1 application  topically daily as needed (itching).     telmisartan-hydrochlorothiazide (MICARDIS HCT) 80-12.5 MG tablet Take 1 tablet by mouth daily.     Turmeric 500 MG CAPS Take 1,000 mg by mouth daily.     valACYclovir  (VALTREX ) 1000 MG tablet Take 1/2 tablet (500 mg total) by mouth 2 (two) times daily. for 5 days as needed. 20 tablet 5   VITAMIN D-VITAMIN K PO Take 5,000 mcg by mouth. 90 mcg / 125 mcg     mirtazapine  (REMERON ) 7.5 MG tablet Take 1 tablet (7.5 mg total) by mouth at bedtime. 30 tablet 11   telmisartan (MICARDIS) 20 MG tablet Take 20 mg by mouth daily. (Patient not taking: Reported on 02/08/2024)     No facility-administered medications prior to visit.     Allergies  Allergen Reactions   Augmentin  [Amoxicillin-Pot Clavulanate] Diarrhea    Severe - Pt request it be listed as an allergy augmentin     Social History   Tobacco Use   Smoking  status: Light Smoker    Current packs/day: 0.10    Average packs/day: 0.1 packs/day for 35.0 years (3.5 ttl pk-yrs)    Types: Cigarettes   Smokeless tobacco: Never   Tobacco comments:    1 pack per week  Substance Use Topics   Alcohol use: No    Alcohol/week: 0.0 standard drinks of alcohol   Drug use: No    Comment: recovering, no now    Social History   Substance and Sexual Activity  Sexual Activity Not Currently   Partners: Male   Comment: declined condoms     Objective  Objective:   Vitals:   02/08/24 0929  BP: 126/82  Pulse: 77  Weight: 136 lb (61.7 kg)  Height: 5\' 6"  (1.676 m)    Body mass index is 21.95 kg/m.  Physical Exam Constitutional:      Appearance: Normal appearance. She is not ill-appearing.  HENT:     Mouth/Throat:     Mouth: Mucous membranes are moist.     Pharynx: Oropharynx is clear.  Eyes:     General: No scleral icterus. Cardiovascular:     Rate and Rhythm: Normal rate and regular rhythm.  Pulmonary:     Effort: Pulmonary effort is normal.  Musculoskeletal:        General: Normal range of motion.  Skin:    General: Skin is warm and dry.  Neurological:     Mental Status: She is oriented to person, place, and time.  Psychiatric:        Mood and Affect: Mood normal.        Thought Content: Thought content normal.     Lab Results Lab Results  Component Value Date   WBC 5.4 01/14/2023   HGB 14.3 01/14/2023   HCT 44.0 01/14/2023   MCV 90.7 01/14/2023   PLT 226 01/14/2023    Lab Results  Component Value Date   CREATININE 1.42 (H) 01/14/2023   BUN 20 01/14/2023   NA 143 01/14/2023   K 4.4 01/14/2023   CL 108 01/14/2023   CO2 26 01/14/2023    Lab Results  Component Value Date   ALT 12 01/14/2023   AST 21 01/14/2023   ALKPHOS 84 03/26/2017   BILITOT 0.5 01/14/2023    Lab Results  Component Value Date   CHOL 227 (H) 01/14/2023   HDL 66 01/14/2023   LDLCALC 127 (H) 01/14/2023   TRIG 204 (H) 01/14/2023   CHOLHDL 3.4  01/14/2023   HIV 1 RNA Quant (Copies/mL)  Date Value  08/03/2023 <20 (H)  01/14/2023 32 (H)  08/13/2022 Not Detected   CD4 T Cell Abs (/uL)  Date Value  08/03/2023 677  01/14/2023 519  08/13/2022 673       Assessment & Plan:     HIV infection, asymptomatic -  VL < 20 and CD4 > 500 -  Asymptomatic with undetectable viral loads and CD4 counts consistently over 600. Excellent medication adherence. Pap smear is up to date. Entered orders for mammogram to be done locally for screening. Colonoscopy coming up locally per her report. Not sexually active.  - Continue current antiretroviral therapy. - Order updated blood work to monitor viral load and CD4 count. - mammogram ordered   Nicotine dependence -  Chronic dependence with interest in quitting. Previous Nicorette gum use was somewhat effective. Discussed nicotine patches and alternative options like Wellbutrin if patches are not covered. Emphasized behavioral strategies and support from friends. - Prescribe nicotine patches for one month, starting with a higher dose. - Instruct to apply patch daily, rotating sites to avoid skin sensitivity. - Advise to contact if insurance does not cover patches to discuss alternatives like Wellbutrin. - Encourage use of behavioral strategies and support from friends to manage cravings. - Schedule follow-up video visit in 4-6 weeks to assess progress.  Poor Appetite -  Has had excellent results with mirtazapine . She  - Continue mirtazapine  for depression management. - Consider Wellbutrin if nicotine patches are not covered and she is interested in additional support for smoking cessation.      Meds ordered this encounter  Medications  mirtazapine  (REMERON ) 7.5 MG tablet    Sig: Take 1 tablet (7.5 mg total) by mouth at bedtime.    Dispense:  30 tablet    Refill:  11    Supervising Provider:   Levern Reader, CYNTHIA [[id number]]   nicotine (NICODERM CQ - DOSED IN MG/24 HOURS) 21 mg/24hr patch    Sig:  Place 1 patch (21 mg total) onto the skin daily.    Dispense:  42 patch    Refill:  0    Supervising Provider:   Liane Redman [phone]   Orders Placed This Encounter  Procedures   MM 3D SCREENING MAMMOGRAM BILATERAL BREAST    [facility] [place]  Address: [address] [place], [place] [address] Phone: [phone]    Standing Status:   Future    Expiration Date:   02/07/2025    Reason for Exam (SYMPTOM  OR DIAGNOSIS REQUIRED):   breast cancer screening    Preferred imaging location?:   External   COMPLETE METABOLIC PANEL WITHOUT GFR   HIV 1 RNA quant-no reflex-bld   T-helper cells (CD4) count   RPR   Lipid panel   Return in about 6 weeks (around 03/21/2024).   Gibson Kurtz, MSN, NP-C [facility] Pager: [phone] Office: [phone]  02/08/24  1:27 PM

## 2024-02-09 LAB — T-HELPER CELLS (CD4) COUNT (NOT AT ARMC)
CD4 % Helper T Cell: 41 % (ref 33–65)
CD4 T Cell Abs: 822 /uL (ref 400–1790)

## 2024-02-10 LAB — COMPLETE METABOLIC PANEL WITHOUT GFR
AG Ratio: 1.7 (calc) (ref 1.0–2.5)
ALT: 16 U/L (ref 6–29)
AST: 23 U/L (ref 10–35)
Albumin: 4.5 g/dL (ref 3.6–5.1)
Alkaline phosphatase (APISO): 58 U/L (ref 37–153)
BUN/Creatinine Ratio: 12 (calc) (ref 6–22)
BUN: 22 mg/dL (ref 7–25)
CO2: 29 mmol/L (ref 20–32)
Calcium: 10.8 mg/dL — ABNORMAL HIGH (ref 8.6–10.4)
Chloride: 105 mmol/L (ref 98–110)
Creat: 1.79 mg/dL — ABNORMAL HIGH (ref 0.50–1.05)
Globulin: 2.7 g/dL (ref 1.9–3.7)
Glucose, Bld: 79 mg/dL (ref 65–99)
Potassium: 4.3 mmol/L (ref 3.5–5.3)
Sodium: 142 mmol/L (ref 135–146)
Total Bilirubin: 0.4 mg/dL (ref 0.2–1.2)
Total Protein: 7.2 g/dL (ref 6.1–8.1)

## 2024-02-10 LAB — LIPID PANEL
Cholesterol: 191 mg/dL (ref <200)
HDL: 47 mg/dL — ABNORMAL LOW (ref >=50)
LDL Cholesterol (Calc): 116 mg/dL — ABNORMAL HIGH
Non-HDL Cholesterol (Calc): 144 mg/dL — ABNORMAL HIGH (ref <130)
Total CHOL/HDL Ratio: 4.1 (calc) (ref <5.0)
Triglycerides: 160 mg/dL — ABNORMAL HIGH (ref <150)

## 2024-02-10 LAB — HIV-1 RNA QUANT-NO REFLEX-BLD
HIV 1 RNA Quant: NOT DETECTED {copies}/mL
HIV-1 RNA Quant, Log: NOT DETECTED {Log_copies}/mL

## 2024-02-10 LAB — RPR: RPR Ser Ql: NONREACTIVE

## 2024-02-23 NOTE — Progress Notes (Signed)
 The 10-year ASCVD risk score (Arnett DK, et al., 2019) is: 19.9%   Values used to calculate the score:     Age: 68 years     Sex: Female     Is Non-Hispanic African American: Yes     Diabetic: No     Tobacco smoker: Yes     Systolic Blood Pressure: 126 mmHg     Is BP treated: Yes     HDL Cholesterol: 47 mg/dL     Total Cholesterol: 191 mg/dL  Arlon Bergamo, BSN, RN

## 2024-03-18 ENCOUNTER — Other Ambulatory Visit (HOSPITAL_COMMUNITY): Payer: Self-pay

## 2024-03-18 ENCOUNTER — Telehealth: Admitting: Infectious Diseases

## 2024-03-18 ENCOUNTER — Encounter: Payer: Self-pay | Admitting: Infectious Diseases

## 2024-03-18 ENCOUNTER — Other Ambulatory Visit: Payer: Self-pay

## 2024-03-18 DIAGNOSIS — B20 Human immunodeficiency virus [HIV] disease: Secondary | ICD-10-CM | POA: Diagnosis not present

## 2024-03-18 DIAGNOSIS — F17210 Nicotine dependence, cigarettes, uncomplicated: Secondary | ICD-10-CM

## 2024-03-18 DIAGNOSIS — F17200 Nicotine dependence, unspecified, uncomplicated: Secondary | ICD-10-CM

## 2024-03-18 NOTE — Progress Notes (Signed)
 Name: Kara Munoz  DOB: 05-May-1956 MRN: [id number] PCP: Ernesto Elspeth PARAS, MD    Brief Narrative:  Kara Munoz is a 68 y.o. female with well controlled HIV, Stage unknown at Dx in 2000 CD4 nadir unknown VL unknown HIV Risk: sexual History of OIs: none known Intake Labs: Hep B sAg (-), sAb (-), cAb (-); Hep A (-), Hep C (-) Quantiferon (-) HLA B*5701 (- 2015) G6PD: ()   Previous Regimens: 2012 - Kaletra  + Truvada  2014 - Reyataz  + Truvada (simpler regimen, less s/e) 2015 - Triumeq  + Evotaz  (mild renal insufficiency)  2019 - Symtuza   Genotypes: None on file   Subjective   VIRTUAL CARE ENCOUNTER  I connected with Kara Munoz on 04/04/24 at  9:45 AM EDT by VIDEO and verified that I am speaking with the correct person using two identifiers.   I discussed the limitations, risks, security and privacy concerns of performing an evaluation and management service by telephone and the availability of in person appointments. I also discussed with the patient that there may be a patient responsible charge related to this service. The patient expressed understanding and agreed to proceed.  Patient Location: [place] residence  Other Participants:   Provider Location: [facility] Office   Subjective:   Chief Complaint  Patient presents with   Follow-up    Working on quit smoking- bill $74 fonicotine  patches- would like for us  to [facility] [place] [facility] everything else     Discussed the use of AI scribe software for clinical note transcription with the patient, who gave verbal consent to proceed.   History of Present Illness   Kara Munoz is a 68 year old female who presents for follow up on smoking cessation with nicotine  patches.   She has been experiencing recurrent urinary tract infections (UTIs) and yeast infections over the past six months, which she associates with her use of Jardiance. She is concerned about the frequency and impact on her  health, noting that this is not her first UTI.  She is dealing with issues related to medication costs with the patches; while they worked exceptionally well for her there is a lot of frustration with the pharmacy as she received a bill 3 weeks after the fact for $79. Given she was not informed ahead of time, she presumed like her other medications she assumed they were covered by insurance policy. She cannot afford the payment.   She is working on quitting smoking and has made progress in this area.          02/08/2024    9:36 AM  Depression screen PHQ 2/9  Decreased Interest 0  Down, Depressed, Hopeless 0  PHQ - 2 Score 0  Altered sleeping 1  Tired, decreased energy 0  Change in appetite 0  Feeling bad or failure about yourself  1  Trouble concentrating 0  Moving slowly or fidgety/restless 0  Suicidal thoughts 0  PHQ-9 Score 2  Difficult doing work/chores Not difficult at all    Review of Systems  Constitutional:  Negative for chills and fever.  HENT:  Negative for tinnitus.   Eyes:  Negative for blurred vision and photophobia.  Respiratory:  Negative for cough and sputum production.   Cardiovascular:  Negative for chest pain.  Gastrointestinal:  Negative for diarrhea, nausea and vomiting.  Genitourinary:  Negative for dysuria.  Skin:  Negative for rash.  Neurological:  Negative for headaches.  Psychiatric/Behavioral:  Positive for depression.  Past Medical History:  Diagnosis Date   Depression    HIV infection (HCC)    Hyperlipidemia     Outpatient Medications Prior to Visit  Medication Sig Dispense Refill   albuterol  (VENTOLIN  HFA) 108 (90 Base) MCG/ACT inhaler Inhale 2 puffs into the lungs every 4 (four) hours as needed.     B Complex-C (SUPER B COMPLEX PO) Take by mouth.     bimatoprost  (LUMIGAN ) 0.01 % SOLN Place 1 drop into both eyes at bedtime.     Biotin 89999 MCG TABS Take by mouth.     Cholecalciferol (VITAMIN D3) 125 MCG (5000 UT) CHEW Chew 5,000  Units by mouth daily.     empagliflozin (JARDIANCE) 10 MG TABS tablet Take by mouth daily.     Ensure (ENSURE) Take 237 mLs by mouth 2 (two) times daily between meals. 237 mL 11   ferrous sulfate 325 (65 FE) MG tablet Take 325 mg by mouth daily with breakfast.     halobetasol (ULTRAVATE) 0.05 % cream      hydrocortisone  (ANUSOL -HC) 2.5 % rectal cream Place 1 application  rectally daily as needed for hemorrhoids or anal itching.     hydroxychloroquine (PLAQUENIL) 200 MG tablet Take 200 mg by mouth daily.     levocetirizine (XYZAL) 5 MG tablet Take 5 mg by mouth every evening.     mirtazapine  (REMERON ) 7.5 MG tablet Take 1 tablet (7.5 mg total) by mouth at bedtime. 30 tablet 11   Multiple Vitamins-Minerals (MULTIVITAMIN WITH MINERALS) tablet Take 1 tablet by mouth daily.     nicotine  (NICODERM CQ  - DOSED IN MG/24 HOURS) 21 mg/24hr patch Place 1 patch (21 mg total) onto the skin daily. 42 patch 0   Olopatadine  HCl (PATADAY ) 0.2 % SOLN Place one drop into both eyes every morning.     omega-3 acid ethyl esters (LOVAZA) 1 g capsule Take 1 g by mouth daily.     PAZEO 0.7 % SOLN Place 1 drop into both eyes daily.  5   pravastatin  (PRAVACHOL ) 40 MG tablet Take 40 mg by mouth daily.      silver  sulfADIAZINE  (SILVADENE ) 1 % cream Apply 1 application  topically daily as needed (itching).     telmisartan-hydrochlorothiazide (MICARDIS HCT) 80-12.5 MG tablet Take 1 tablet by mouth daily.     Turmeric 500 MG CAPS Take 1,000 mg by mouth daily.     valACYclovir  (VALTREX ) 1000 MG tablet Take 1/2 tablet (500 mg total) by mouth 2 (two) times daily. for 5 days as needed. 20 tablet 5   VITAMIN D-VITAMIN K PO Take 5,000 mcg by mouth. 90 mcg / 125 mcg     Darunavir-Cobicistat -Emtricitabine -Tenofovir  Alafenamide (SYMTUZA ) 800-150-200-10 MG TABS Take 1 tablet by mouth daily with breakfast. 30 tablet 11   No facility-administered medications prior to visit.     Allergies  Allergen Reactions   Augmentin   [Amoxicillin-Pot Clavulanate] Diarrhea    Severe - Pt request it be listed as an allergy augmentin     Social History   Tobacco Use   Smoking status: Light Smoker    Current packs/day: 0.10    Average packs/day: 0.1 packs/day for 35.0 years (3.5 ttl pk-yrs)    Types: Cigarettes   Smokeless tobacco: Never   Tobacco comments:    1 pack per week  Substance Use Topics   Alcohol use: No    Alcohol/week: 0.0 standard drinks of alcohol   Drug use: No    Comment: recovering, no now    Social  History   Substance and Sexual Activity  Sexual Activity Not Currently   Partners: Male   Comment: declined condoms     Objective   Objective:   There were no vitals filed for this visit.   There is no height or weight on file to calculate BMI.  Physical Exam Constitutional:      Appearance: Normal appearance. She is not ill-appearing.  HENT:     Mouth/Throat:     Mouth: Mucous membranes are moist.     Pharynx: Oropharynx is clear.   Eyes:     General: No scleral icterus.   Cardiovascular:     Rate and Rhythm: Normal rate and regular rhythm.  Pulmonary:     Effort: Pulmonary effort is normal.   Musculoskeletal:        General: Normal range of motion.   Skin:    General: Skin is warm and dry.   Neurological:     Mental Status: She is oriented to person, place, and time.   Psychiatric:        Mood and Affect: Mood normal.        Thought Content: Thought content normal.     Lab Results Lab Results  Component Value Date   WBC 5.4 01/14/2023   HGB 14.3 01/14/2023   HCT 44.0 01/14/2023   MCV 90.7 01/14/2023   PLT 226 01/14/2023    Lab Results  Component Value Date   CREATININE 1.79 (H) 02/08/2024   BUN 22 02/08/2024   NA 142 02/08/2024   K 4.3 02/08/2024   CL 105 02/08/2024   CO2 29 02/08/2024    Lab Results  Component Value Date   ALT 16 02/08/2024   AST 23 02/08/2024   ALKPHOS 84 03/26/2017   BILITOT 0.4 02/08/2024    Lab Results  Component Value  Date   CHOL 191 02/08/2024   HDL 47 (L) 02/08/2024   LDLCALC 116 (H) 02/08/2024   TRIG 160 (H) 02/08/2024   CHOLHDL 4.1 02/08/2024   HIV 1 RNA Quant  Date Value  02/08/2024 NOT DETECTED copies/mL  08/03/2023 <20 Copies/mL (H)  01/14/2023 32 Copies/mL (H)   CD4 T Cell Abs (/uL)  Date Value  02/08/2024 822  08/03/2023 677  01/14/2023 519       Assessment & Plan:     Tobacco Cessation -  Improving on Nicotine  patches however cost is prohibiting for further dispenses. I completely understand her frustration with the pharmacy with the lack of transparency with pricing and blindsiding her with a bill like that. If she cannot get them to retract the bill, maybe a payment plan can be created.   Adverse effects of Jardiance?  Experiencing adverse effects from Jardiance, including candidiasis and urinary tract infections. Discussion on the frequency of these side effects and their impact on quality of life.  Advised to evaluate if the benefits of Jardiance outweigh the side effects with her prescribing team.  - Discuss with Dr. Ernesto the adverse effects of Jardiance and evaluate if the benefits outweigh the side effects. - Keep a record of side effects to present to Dr. Ernesto for evaluation.  Recording duration: 23 minutes  Additional time spent completing visit: 9 minutes including time spent contacting pharmacy to understand what's going on with medications  Total Encounter time: 32 min     No orders of the defined types were placed in this encounter.  No orders of the defined types were placed in this encounter.  As scheduled for  routine HIV are in 08/01/2024   Kara Fireman, MSN, NP-C [facility] for Infectious Disease [facility] Pager: [phone] Office: [phone]  04/04/24  9:32 AM

## 2024-03-21 ENCOUNTER — Telehealth: Payer: Self-pay

## 2024-03-21 DIAGNOSIS — B20 Human immunodeficiency virus [HIV] disease: Secondary | ICD-10-CM

## 2024-03-21 MED ORDER — SYMTUZA 800-150-200-10 MG PO TABS: 1.0000 | ORAL_TABLET | Freq: Every day | ORAL | 4 refills | Status: DC

## 2024-03-21 NOTE — Telephone Encounter (Signed)
 Spoke with Kara Munoz, she would like her Symtuza  sent to the [facility]  from now on [phone]). New Rx sent.   Tarin Johndrow, BSN, RN

## 2024-03-21 NOTE — Telephone Encounter (Signed)
 Received voicemail from patient requesting call back regarding [facility]. Ivana Maris, no answer. Left HIPAA compliant voicemail requesting callback.   Qaadir Kent, BSN, RN

## 2024-03-21 NOTE — Addendum Note (Signed)
 Addended by: Arlon Bergamo D on: 03/21/2024 01:38 PM   Modules accepted: Orders

## 2024-03-28 ENCOUNTER — Telehealth: Payer: Self-pay

## 2024-03-28 NOTE — Telephone Encounter (Signed)
 Patient called office stating her PCP Dr. Mona Angle recommended Hep A vaccine and Meningitis vaccine. Would like to discus vaccines with provider at next visit and if needed would like vaccines at appt. Julien Odor, RMA

## 2024-05-12 ENCOUNTER — Telehealth: Payer: Self-pay

## 2024-05-12 DIAGNOSIS — F3341 Major depressive disorder, recurrent, in partial remission: Secondary | ICD-10-CM

## 2024-05-12 MED ORDER — MIRTAZAPINE 7.5 MG PO TABS: 7.5000 mg | ORAL_TABLET | Freq: Every day | ORAL | 5 refills | Status: AC

## 2024-05-12 NOTE — Telephone Encounter (Signed)
 Patient left voicemail requesting that mirtazapine  be transferred from [facility] to [facility].   Spoke with [place]  at [facility] to have Rx canceled there.   Called patient, notified her that Rx has been transferred.  Tyree Vandruff, BSN, RN

## 2024-05-24 NOTE — ED Provider Notes (Signed)
 [facility] Emergency Department Physician Note   Chief Complaint  Patient presents with  . Chest Pain  . Shortness of Breath   HPI:  Patient is a 68 year old female with past medical history of anxiety, depression, HIV who presents to the ED with concern for chest pain.  Patient reports over the last 5 hours or so she has had some pain in the left side of her chest.  Describes it as a soreness/pressure sensation.  Reports it hurts worse with certain movements.  Denies any shortness of breath (this differs from triage) per denies any fever or cough.  Denies any falls or increased physical activity.  Has not taken anything for the pain but reports it has been improving on its own.  She states it is close to her heart so she wanted to have it checked out.  No abdominal pain.  No nausea or vomiting.  No recent travel.   Patient History Past Medical History:  Diagnosis Date  . Abnormal Pap smear   . Anxiety   . Depression   . Facial swelling   . Genital herpes   . Genital warts   . Glaucoma   . Herpes simplex without mention of complication   . HIV infection    (CMD)   . HIV positive    (CMD)   . Hyperlipidemia   . Polysubstance abuse (CMD) 07/23/2006   Formatting of this note might be different from the original. Formatting of this note might be different from the original. Annotation: EtOH, MJ, cocaine Qualifier: Diagnosis of  By: Elaine MD, John   Last Assessment & Plan:  Formatting of this note might be different from the original. Although her depression has relapsed she remains sober and off of drugs. She has been drug free for over a dec  . STD (sexually transmitted disease)    Surgical History[1]     ROS: Please refer to HPI for pertinent ROS    Physical Exam: ED Triage Vitals [05/24/24 1237]  Temp 97.3 F (36.3 C)  Heart Rate 70  Resp (!) 22  BP (!) 139/55  MAP (mmHg) 88  SpO2 99 %  O2 Device   O2 Flow Rate  (L/min)   Weight 65 kg (143 lb 3.2 oz)   Physical Exam Vitals and nursing note reviewed.  Constitutional:      Appearance: She is well-developed.  HENT:     Head: Atraumatic.     Nose: Nose normal.   Eyes:     Extraocular Movements: Extraocular movements intact.     Conjunctiva/sclera: Conjunctivae normal.    Cardiovascular:     Rate and Rhythm: Normal rate and regular rhythm.  Pulmonary:     Effort: Pulmonary effort is normal. No respiratory distress.  Chest:     Chest wall: Tenderness (Point L chest wall TTP) present.  Abdominal:     General: There is no distension.     Tenderness: There is no abdominal tenderness.   Neurological:     General: No focal deficit present.     Mental Status: She is alert. Mental status is at baseline.   Psychiatric:        Mood and Affect: Mood normal.        Behavior: Behavior normal.     Procedures                       Medical Decision Making: On arrival, pt is afebrile,  hemodynamically stable  Differential Diagnosis: Musculoskeletal chest wall pain, less likely rib fracture as she denies any trauma, ACS, PE No overlying skin findings to suggest cellulitis/zoster  Diagnostics: EKG Reviewed and interpreted by me : Normal sinus rhythm, no acute ischemic findings  Labs: No leukocytosis, CMP notable for creatinine 1.7, previously 1.6 initial troponin negative  Imaging Reviewed and interpreted by me: X-ray shows no acute findings  ED Course: Patient presents with some focal left-sided anterior chest wall pain/chest pain.  Is reproducible to palpation and certain movements.  Upon initial assessment, patient reports she is feeling better and is asking to leave.  After further discussion, she is amenable to further workup.  D-dimer negative, doubt PE.  Delta troponin negative, presentation appears atypical for ACS.  Overall, given the focal pain and reproducibility, likely musculoskeletal in nature.  Have discussed symptomatic expectant  management at home but encouraged to follow with PCP in general from ED visit.  Have discussed signs or symptoms of necessitate prompt return to the emergency department.  Patient stable for discharge at this time. Strict return precautions given.  Clinical Complexity  Details documented in MDM  The patient presentation is consistent with  acute presentation with potential threat to life or bodily function  Clinical complexity and Data Reviewed: -External Data Reviewed: Labs, EKG, notes -Radiology: independent interpretation -Tests: independent interpretation -Risks:  --OTC drugs --Decision regarding hospitalization Social Drivers of Health with Concerns   Tobacco Use: High Risk (05/10/2024)   Patient History   . Smoking Tobacco Use: Every Day   . Smokeless Tobacco Use: Never   . Passive Exposure: Current    Evaluation and diagnostic testing in the emergency department does not suggest an emergent condition requiring admission or immediate intervention beyond what is been performed at this time.  The patient is safe for discharge and has been instructed to return immediately for worsening symptoms, change in symptoms or any other concerns.  1. Chest pain, unspecified type        ED Disposition     ED Disposition  Discharge   Condition  Stable   Comment  --              [1] Past Surgical History: Procedure Laterality Date  . ABDOMINAL SURGERY      Procedure: ABDOMINAL SURGERY  . APPENDECTOMY     Procedure: APPENDECTOMY  . CERVIX LESION DESTRUCTION  05/01/2021   Procedure: CERVIX LESION DESTRUCTION  . COLONOSCOPY N/A 11/19/2017   Procedure: COLONOSCOPY;  Surgeon: Elsie Fausto Lisabeth Mickey., MD MPH;  Location: [facility] ENDO OR;  Service: Gastroenterology;  Laterality: N/A;  . COLPOSCOPY     Procedure: COLPOSCOPY  . DESTRUCTION OF LESION ANAL     Procedure: ANAL LESION  . EXAMINATION UNDER ANESTHESIA N/A 02/15/2018   Procedure: EXAM UNDER ANESTHESIA (EUA),;   Surgeon: Wash Yancy Cables, MD;  Location: [facility] MAIN OR;  Service: General;  Laterality: N/A;  . EXAMINATION UNDER ANESTHESIA N/A 05/26/2018   Procedure: RECTAL EXAM UNDER ANESTHESIA (EUA);  Surgeon: Wash Yancy Cables, MD;  Location: [facility] MAIN OR;  Service: General;  Laterality: N/A;  . GYNECOLOGIC CRYOSURGERY     Procedure: GYNECOLOGIC CRYOSURGERY  . HEMORROIDECTOMY N/A 02/15/2018   Procedure: HEMORRHOIDECTOMY;  Surgeon: Wash Yancy Cables, MD;  Location: [facility] MAIN OR;  Service: General;  Laterality: N/A;  . HYSTERECTOMY      Procedure: HYSTERECTOMY  . HYSTERECTOMY ABDOMINAL W/ BSO OPEN  2013   Procedure: HYSTERECTOMY ABDOMINAL W/ BSO OPEN  . OOPHORECTOMY  Procedure: OOPHORECTOMY  . TUBAL LIGATION     Procedure: TUBAL LIGATION  . UMBILICAL HERNIA REPAIR     Procedure: UMBILICAL HERNIA REPAIR

## 2024-05-31 NOTE — Progress Notes (Signed)
 Chief Complaint  Patient presents with  . Dysuria  . Urinary Frequency    Onset of symptoms - 2 days    HPI  Patient comes in today for same or next office day urgent visit.  Comes in today for 2 days of dysuria and urinary frequency.  Reached out to our pain management team earlier today and noted I think I may have a UTI so I am going to see Dr.Spivey this afternoon. I am having a lot of pain still on my left hip and left side of my back. If it is not a UTI I may call and see if I can get an earlier appointment with Dr.Bullard.  She had a steroid injection for her chronic back pain yesterday per her report.  Describes pain in the left hip region that radiates down the leg and notes she is here to make sure it does not seem related to her kidneys.  At end of visit she also notes that she felt a little bit of vaginal discharge and questions if she could have a yeast infection    ROS -As noted in HPI  Medical History[1]   Exam  Vitals:   05/31/24 1328  BP: 117/73  Pulse: 75  Resp: 20  Temp: 97.9 F (36.6 C)  TempSrc: Temporal  SpO2: 99%  Weight: 64.8 kg (142 lb 12.8 oz)  Height: 1.676 m (5' 6)   Body mass index is 23.05 kg/m.  Constitutional: Patient appears well-developed and well-nourished. No distress.  Eyes: Conjunctivae are normal. Right eye exhibits no discharge. Left eye exhibits no discharge. No scleral icterus.  Pulmonary/Chest: Effort normal. No respiratory distress.  Neurological: Alert. Skin: No rash noted. Not diaphoretic     Assessment/Plan:  Diagnoses and all orders for this visit: Urinary frequency -     Urine Culture -     POC Urinalysis Auto without Microscopic -     Bacterial Vaginosis (BV), Candida, and Trichomonas via NAA (Swab); Future  Urine dip largely benign with the exception of a bit of glucose which is expected given her Jardiance.  Will follow-up with a urine culture and also a BV profile given she notes some possible  discharge.  Recommended she follow-up with pain management team for further recommendations as this discomfort does not seem related to a UTI or obvious renal source.  If symptoms do not improve may need to workup further however I have a strong suspicion that this pain is related to her chronic lumbar degeneration  Portions of this note were dictated using Animal nutritionist.  It has been reviewed for accuracy, but may contain grammatical and clerical errors.   Elspeth Minium, MD FAAFP         [1] Past Medical History: Diagnosis Date  . Abnormal Pap smear   . Anxiety   . Depression   . Facial swelling   . Genital herpes   . Genital warts   . Glaucoma   . Herpes simplex without mention of complication   . HIV infection    (CMD)   . HIV positive    (CMD)   . Hyperlipidemia   . Polysubstance abuse (CMD) 07/23/2006   Formatting of this note might be different from the original. Formatting of this note might be different from the original. Annotation: EtOH, MJ, cocaine Qualifier: Diagnosis of  By: Elaine MD, John   Last Assessment & Plan:  Formatting of this note might be different from the original. Although her depression has  relapsed she remains sober and off of drugs. She has been drug free for over a dec  . STD (sexually transmitted disease)

## 2024-08-01 ENCOUNTER — Ambulatory Visit (INDEPENDENT_AMBULATORY_CARE_PROVIDER_SITE_OTHER): Admitting: Infectious Diseases

## 2024-08-01 ENCOUNTER — Other Ambulatory Visit: Payer: Self-pay

## 2024-08-01 VITALS — BP 148/78 | HR 64 | Temp 97.4°F | Wt 152.0 lb

## 2024-08-01 DIAGNOSIS — Z789 Other specified health status: Secondary | ICD-10-CM | POA: Diagnosis not present

## 2024-08-01 DIAGNOSIS — Z23 Encounter for immunization: Secondary | ICD-10-CM

## 2024-08-01 DIAGNOSIS — B20 Human immunodeficiency virus [HIV] disease: Secondary | ICD-10-CM

## 2024-08-01 MED ORDER — SYMTUZA 800-150-200-10 MG PO TABS: 1.0000 | ORAL_TABLET | Freq: Every day | ORAL | 5 refills | Status: AC

## 2024-08-01 MED ORDER — ENSURE PO LIQD: 237.0000 mL | Freq: Two times a day (BID) | ORAL | 11 refills | Status: AC

## 2024-08-01 NOTE — Patient Instructions (Addendum)
 See you again in 6 months! No changes to your medications at this time.   Ask your landlord what to do regarding any specific paperwork you need - I will provide a letter for recommendation of an emotional support animal for you.   [employer] [employer] [facility]  Address: [address] [place], [place] [address]  [facility]

## 2024-08-01 NOTE — Progress Notes (Signed)
 Patient: Kara Munoz  DOB: [birth year] MRN: [id number] PCP: Ernesto Elspeth PARAS, MD   Reason for Visit: 13-month follow-up    Subjective   Subjective:   Evolette Pendell is a 68 year old female that is being seen in clinic today for 86-month follow up. Since her last visit she rate her overall health as good. She states she recently got baptist at church on 04/2024. She states she has a good support system through her church. She is looking to get an emotional support dog for her anxiety and depression. She also states since her last visit, she has gained 4 lbs. She wants a small dog, Yorkie size. She states she remains daily adherence to her Symtuza  with no missed doses or adverse affects. She states that she quit smoking and is going on 15-months. Since her last visit, she has not started no new medications. No recent hospitalizations, but was seen in the ED on 05/2024 for chest pain and shortness of breath, she was not hospitalized at the time.  She states she is not sexually active. States she has stable housing, food, and transportation. She states she has a Pap appointment coming up, last mammogram (2025), and last colonoscopy was (2024). She wants to get the flu shot. Denies fever, chills, night sweats, chest pain, and shortness of breath.   Review of Systems  All other systems reviewed and are negative.   Past Medical History:  Diagnosis Date   Depression    HIV infection (HCC)    Hyperlipidemia     Outpatient Medications Prior to Visit  Medication Sig Dispense Refill   albuterol  (VENTOLIN  HFA) 108 (90 Base) MCG/ACT inhaler Inhale 2 puffs into the lungs every 4 (four) hours as needed.     B Complex-C (SUPER B COMPLEX PO) Take by mouth.     bimatoprost  (LUMIGAN ) 0.01 % SOLN Place 1 drop into both eyes at bedtime.     Biotin 89999 MCG TABS Take by mouth.     Cholecalciferol (VITAMIN D3) 125 MCG (5000 UT) CHEW Chew 5,000 Units by mouth daily.     empagliflozin (JARDIANCE) 10 MG  TABS tablet Take by mouth daily.     Ensure (ENSURE) Take 237 mLs by mouth 2 (two) times daily between meals. 237 mL 11   ferrous sulfate 325 (65 FE) MG tablet Take 325 mg by mouth daily with breakfast.     halobetasol (ULTRAVATE) 0.05 % cream      hydrocortisone  (ANUSOL -HC) 2.5 % rectal cream Place 1 application  rectally daily as needed for hemorrhoids or anal itching.     hydroxychloroquine (PLAQUENIL) 200 MG tablet Take 200 mg by mouth daily.     levocetirizine (XYZAL) 5 MG tablet Take 5 mg by mouth every evening.     mirtazapine  (REMERON ) 7.5 MG tablet Take 1 tablet (7.5 mg total) by mouth at bedtime. 30 tablet 5   Multiple Vitamins-Minerals (MULTIVITAMIN WITH MINERALS) tablet Take 1 tablet by mouth daily.     nicotine  (NICODERM CQ  - DOSED IN MG/24 HOURS) 21 mg/24hr patch Place 1 patch (21 mg total) onto the skin daily. 42 patch 0   Olopatadine  HCl (PATADAY ) 0.2 % SOLN Place one drop into both eyes every morning.     omega-3 acid ethyl esters (LOVAZA) 1 g capsule Take 1 g by mouth daily.     PAZEO 0.7 % SOLN Place 1 drop into both eyes daily.  5   pravastatin  (PRAVACHOL ) 40 MG tablet Take 40 mg by mouth  daily.      silver  sulfADIAZINE  (SILVADENE ) 1 % cream Apply 1 application  topically daily as needed (itching).     telmisartan-hydrochlorothiazide (MICARDIS HCT) 80-12.5 MG tablet Take 1 tablet by mouth daily.     Turmeric 500 MG CAPS Take 1,000 mg by mouth daily.     valACYclovir  (VALTREX ) 1000 MG tablet Take 1/2 tablet (500 mg total) by mouth 2 (two) times daily. for 5 days as needed. 20 tablet 5   VITAMIN D-VITAMIN K PO Take 5,000 mcg by mouth. 90 mcg / 125 mcg     Darunavir-Cobicistat -Emtricitabine -Tenofovir  Alafenamide (SYMTUZA ) 800-150-200-10 MG TABS Take 1 tablet by mouth daily with breakfast. 30 tablet 4   No facility-administered medications prior to visit.     Allergies  Allergen Reactions   Augmentin  [Amoxicillin-Pot Clavulanate] Diarrhea    Severe - Pt request it be listed  as an allergy augmentin     Social History   Tobacco Use   Smoking status: Light Smoker    Current packs/day: 0.10    Average packs/day: 0.1 packs/day for 35.0 years (3.5 ttl pk-yrs)    Types: Cigarettes   Smokeless tobacco: Never   Tobacco comments:    1 pack per week  Substance Use Topics   Alcohol use: No    Alcohol/week: 0.0 standard drinks of alcohol   Drug use: No    Comment: recovering, no now    No family history on file.     Objective   Objective:   Vitals:   08/01/24 1000  BP: (!) 148/78  Pulse: 64  Temp: (!) 97.4 F (36.3 C)  SpO2: 97%  Weight: 152 lb (68.9 kg)   Body mass index is 24.53 kg/m.  Physical Exam Vitals reviewed.  Constitutional:      Appearance: Normal appearance.  Cardiovascular:     Rate and Rhythm: Normal rate and regular rhythm.  Pulmonary:     Effort: Pulmonary effort is normal.     Breath sounds: Normal breath sounds.  Musculoskeletal:        General: Normal range of motion.  Neurological:     Mental Status: She is alert and oriented to person, place, and time.  Psychiatric:        Mood and Affect: Mood normal.        Behavior: Behavior normal.        Thought Content: Thought content normal.        Judgment: Judgment normal.        Assessment & Plan:   Problem List Items Addressed This Visit       Other   Human immunodeficiency virus (HIV) disease (HCC) - Primary   Relevant Medications   Darunavir-Cobicistat -Emtricitabine -Tenofovir  Alafenamide (SYMTUZA ) 800-150-200-10 MG TABS   Other Relevant Orders   Basic metabolic panel   HIV 1 RNA quant-no reflex-bld   T-helper cells (CD4) count   Other Visit Diagnoses       Measles, mumps, rubella (MMR) vaccination status unknown       Relevant Orders   Measles/Mumps/Rubella Immunity     Need for influenza vaccination       Relevant Orders   Flu vaccine HIGH DOSE PF(Fluzone Trivalent) (Completed)       Assessment and Plan  -HIV Disease Current regiment  includes daily Symtuza . Viral Load: Not detected and CD4: 822. Continue with current regimen. Will obtain BNP to trend kidney function.   -Health Maintenance  Preventive care reviewed and up-to-date. Immunization reviewed, she is agreeable to flu vaccine today.  Will obtain MMR titer. Will obtain meningococcal vaccine on next office visit. Will give paperwork for emotional support animal. S/P hysterectomy - no need for further paps Mammogram order entered.   -Hyperlipidemia  Lipid panel reviewed with patient. Decrease processed food, saturated fats, and limit carbohydrate intake. Encouraged fiber intake and unsaturated fats. Continue with Pravastatin .   Orders Placed This Encounter  Procedures   Flu vaccine HIGH DOSE PF(Fluzone Trivalent)   Measles/Mumps/Rubella Immunity   Basic metabolic panel   HIV 1 RNA quant-no reflex-bld   T-helper cells (CD4) count    Release to patient:   Immediate [1]    Meds ordered this encounter  Medications   Ensure (ENSURE)    Sig: Take 237 mLs by mouth 2 (two) times daily between meals.    Dispense:  237 mL    Refill:  11   Darunavir-Cobicistat -Emtricitabine -Tenofovir  Alafenamide (SYMTUZA ) 800-150-200-10 MG TABS    Sig: Take 1 tablet by mouth daily with breakfast.    Dispense:  30 tablet    Refill:  5    Prescription Type::   Renewal    No follow-ups on file.   Corean Fireman, MSN, NP-C [facility]  [email or web address] Pager: [phone] Office: [phone] [facility] Main Line: [phone] *Secure Chat Communication Welcome

## 2024-08-01 NOTE — Progress Notes (Signed)
 I saw the patient independently of the student NP and conducted my own interview / exam.  I agree with the documentation provided with the following additions:  Discussed the use of AI scribe software for clinical note transcription with the patient, who gave verbal consent to proceed.  History of Present Illness   Kara Munoz is a 68 year old female who presents for follow-up on her mental health and medication management.  She has stopped smoking two months ago and is doing well without any side effects from her current medications, including Symtuza  and Jardiance. She has gained four pounds and notes that the weight has gone to her stomach rather than her preferred areas. She continues to take mirtazapine  as an appetite stimulant.  She is interested in obtaining a small dog as an emotional support animal to help with her anxiety and depression. She believes that having a dog will provide her with motivation and companionship, which she feels is beneficial for her mental health.  She has been keeping up with her preventative care and is interested in receiving a flu shot. She is open to checking her MMR titer for additional immunizations.  Her current medications include Symtuza , Jardiance, and mirtazapine . She is taking mirtazapine  every other day to manage her appetite and mood.  She has a history of manic depression, which she feels is currently well-managed. However, she notes that if she remains inactive for too long, she tends to 'go in her head', indicating a need for external motivation to stay active.      Physical Exam Constitutional:      Appearance: Normal appearance. She is not ill-appearing.  HENT:     Mouth/Throat:     Mouth: Mucous membranes are moist.     Pharynx: Oropharynx is clear.  Eyes:     General: No scleral icterus. Cardiovascular:     Rate and Rhythm: Normal rate.  Pulmonary:     Effort: Pulmonary effort is normal.  Skin:    General: Skin is warm and  dry.  Neurological:     Mental Status: She is oriented to person, place, and time.  Psychiatric:        Mood and Affect: Mood normal.        Thought Content: Thought content normal.      HIV 1 RNA Quant  Date Value  02/08/2024 NOT DETECTED copies/mL  08/03/2023 <20 Copies/mL (H)  01/14/2023 32 Copies/mL (H)   CD4 T Cell Abs (/uL)  Date Value  02/08/2024 822  08/03/2023 677  01/14/2023 519   Lab Results  Component Value Date   CREATININE 1.79 (H) 02/08/2024   CREATININE 1.42 (H) 01/14/2023   CREATININE 1.33 (H) 08/13/2022    Assessment and Plan    HIV infection - HIV infection is well-managed with Symtuza , with an undetectable viral load and stable CD4 count. There is concern for chronic kidney disease due to elevated creatinine levels, potentially related to tenofovir  in Symtuza . - Order viral load and CD4 count - Monitor kidney function due to tenofovir  in Symtuza  - Continue Symtuza  with monitoring - Vaccines updated today - she would like menveo at next visit.   Chronic kidney disease - Chronic kidney disease with a creatinine level of 1.7 recently. BP 148/86 today on treatment.  - Recheck basic metabolic panel to monitor kidney function - Consider nephrology referral if creatinine rises above 2  Immunization management - Influenza vaccination is due and will be administered today. Meningococcal vaccination is due and will  be administered today. MMR titer assessment is considered due to uncertainty about measles immunity. - Administer influenza vaccination today - Plan to administer meningococcal vaccination at a later date - Order MMR titer assessment to check for measles immunity  Manic Depressive Disorder - She is concerned about feeling more symptom exacerbation and looking for emotional support animal to help with controlling this condition.  - Continue mirtazapine  with refills - Provide emotional support animal letter for anxiety and depression  management  Hyperlipidemia - Hyperlipidemia is managed with pravastatin , with adherence to the medication schedule. No recent changes in lipid levels are planned as dietary influences are considered significant. - Continue current pravastatin  regimen   Poor Appetite -  continue mirtazapine  7.5 mg daily (she likes every other day to help avoid over stimulation of appetite).      Orders Placed This Encounter  Procedures   Flu vaccine HIGH DOSE PF(Fluzone Trivalent)   Measles/Mumps/Rubella Immunity   Basic metabolic panel   HIV 1 RNA quant-no reflex-bld   T-helper cells (CD4) count    Release to patient:   Immediate [1]   Meds ordered this encounter  Medications   Ensure (ENSURE)    Sig: Take 237 mLs by mouth 2 (two) times daily between meals.    Dispense:  237 mL    Refill:  11   Darunavir-Cobicistat -Emtricitabine -Tenofovir  Alafenamide (SYMTUZA ) 800-150-200-10 MG TABS    Sig: Take 1 tablet by mouth daily with breakfast.    Dispense:  30 tablet    Refill:  5    Prescription Type::   Renewal     Corean Fireman, MSN, NP-C [facility]  [email or web address] Pager: [phone] Office: [phone] [facility] Main Line: [phone] *Secure Chat Communication Welcome

## 2024-08-02 LAB — T-HELPER CELLS (CD4) COUNT (NOT AT ARMC)
CD4 % Helper T Cell: 41 % (ref 33–65)
CD4 T Cell Abs: 970 /uL (ref 400–1790)

## 2024-08-03 LAB — BASIC METABOLIC PANEL WITH GFR
BUN/Creatinine Ratio: 17 (calc) (ref 6–22)
BUN: 22 mg/dL (ref 7–25)
CO2: 28 mmol/L (ref 20–32)
Calcium: 10.4 mg/dL (ref 8.6–10.4)
Chloride: 107 mmol/L (ref 98–110)
Creat: 1.33 mg/dL — ABNORMAL HIGH (ref 0.60–1.00)
Glucose, Bld: 85 mg/dL (ref 65–99)
Potassium: 4.2 mmol/L (ref 3.5–5.3)
Sodium: 143 mmol/L (ref 135–146)
eGFR: 43 mL/min/1.73m2 — ABNORMAL LOW (ref >=60)

## 2024-08-03 LAB — MEASLES/MUMPS/RUBELLA IMMUNITY
Mumps IgG: 103 [AU]/ml
Rubella: 4.38 {index}
Rubeola IgG: >300 [AU]/ml

## 2024-08-03 LAB — HIV-1 RNA QUANT-NO REFLEX-BLD
HIV 1 RNA Quant: NOT DETECTED {copies}/mL
HIV-1 RNA Quant, Log: NOT DETECTED {Log_copies}/mL

## 2024-08-10 ENCOUNTER — Ambulatory Visit: Payer: Self-pay | Admitting: Infectious Diseases

## 2024-08-10 NOTE — Telephone Encounter (Signed)
 Left voicemail informing patient that results came back with no concerns at this time. Will send mychart message. Lorenda CHRISTELLA Code, RMA

## 2024-08-10 NOTE — Telephone Encounter (Signed)
-----   Message from Corean Fireman sent at 08/10/2024  3:05 PM EDT ----- Please let Kara Munoz know that her labs look great. Kidney function is better - happy to see that for her.  She is immune to measles and does not need any booster vaccine And of course her viral load remains undetectable.   No changes from me  ----- Message ----- From: Interface, [facility] Lab Results In Sent: 08/01/2024  10:35 PM EDT To: Corean LOISE Fireman, NP

## 2025-01-19 ENCOUNTER — Ambulatory Visit: Admitting: Infectious Diseases
